# Patient Record
Sex: Female | Born: 1965 | Race: White | Hispanic: No | Marital: Married | Smoking: Former smoker
Health system: Southern US, Community
[De-identification: ages and names within clinical notes are randomized; demographics above are authoritative.]

## PROBLEM LIST (undated history)

## (undated) DIAGNOSIS — Z9889 Other specified postprocedural states: Secondary | ICD-10-CM

## (undated) DIAGNOSIS — F909 Attention-deficit hyperactivity disorder, unspecified type: Secondary | ICD-10-CM

## (undated) DIAGNOSIS — R112 Nausea with vomiting, unspecified: Secondary | ICD-10-CM

## (undated) DIAGNOSIS — Z973 Presence of spectacles and contact lenses: Secondary | ICD-10-CM

## (undated) DIAGNOSIS — F419 Anxiety disorder, unspecified: Secondary | ICD-10-CM

## (undated) DIAGNOSIS — K219 Gastro-esophageal reflux disease without esophagitis: Secondary | ICD-10-CM

## (undated) HISTORY — PX: BRACHIOPLASTY: SUR162

## (undated) HISTORY — PX: FOOT SURGERY: SHX648

## (undated) HISTORY — PX: INNER THIGH LIFT: SHX6416

## (undated) HISTORY — DX: Gastro-esophageal reflux disease without esophagitis: K21.9

## (undated) HISTORY — DX: Anxiety disorder, unspecified: F41.9

## (undated) HISTORY — PX: OTHER SURGICAL HISTORY: SHX169

---

## 2009-04-18 HISTORY — PX: BREAST BIOPSY: SHX20

## 2009-12-02 ENCOUNTER — Ambulatory Visit: Payer: Self-pay | Admitting: General Surgery

## 2010-05-20 ENCOUNTER — Ambulatory Visit: Payer: Self-pay | Admitting: General Surgery

## 2011-05-24 ENCOUNTER — Ambulatory Visit: Payer: Self-pay | Admitting: General Surgery

## 2011-07-14 ENCOUNTER — Ambulatory Visit: Payer: Self-pay | Admitting: Obstetrics & Gynecology

## 2011-07-14 LAB — CBC: MCH: 29.8 pg (ref 26.0–34.0)

## 2011-07-28 ENCOUNTER — Ambulatory Visit: Payer: Self-pay | Admitting: Obstetrics & Gynecology

## 2013-02-12 ENCOUNTER — Emergency Department: Payer: Self-pay | Admitting: Internal Medicine

## 2013-02-12 LAB — URINALYSIS, COMPLETE
Bilirubin,UR: NEGATIVE
Blood: NEGATIVE
Glucose,UR: NEGATIVE mg/dL (ref 0–75)
Nitrite: NEGATIVE
Protein: NEGATIVE
RBC,UR: 3 /HPF (ref 0–5)
Specific Gravity: 1.017 (ref 1.003–1.030)
Squamous Epithelial: 2

## 2013-02-12 LAB — COMPREHENSIVE METABOLIC PANEL
Albumin: 3.5 g/dL (ref 3.4–5.0)
Alkaline Phosphatase: 72 U/L (ref 50–136)
BUN: 14 mg/dL (ref 7–18)
Chloride: 103 mmol/L (ref 98–107)
Co2: 30 mmol/L (ref 21–32)
Creatinine: 1.01 mg/dL (ref 0.60–1.30)
Glucose: 104 mg/dL — ABNORMAL HIGH (ref 65–99)
Potassium: 3.4 mmol/L — ABNORMAL LOW (ref 3.5–5.1)
Total Protein: 7 g/dL (ref 6.4–8.2)

## 2013-02-12 LAB — CBC
HCT: 35.5 % (ref 35.0–47.0)
HGB: 12.5 g/dL (ref 12.0–16.0)
MCH: 29.7 pg (ref 26.0–34.0)
MCV: 84 fL (ref 80–100)
Platelet: 244 10*3/uL (ref 150–440)
RBC: 4.21 10*6/uL (ref 3.80–5.20)
RDW: 13.1 % (ref 11.5–14.5)

## 2013-02-19 ENCOUNTER — Other Ambulatory Visit: Payer: Self-pay | Admitting: Internal Medicine

## 2013-02-19 DIAGNOSIS — R109 Unspecified abdominal pain: Secondary | ICD-10-CM

## 2013-02-21 ENCOUNTER — Ambulatory Visit
Admission: RE | Admit: 2013-02-21 | Discharge: 2013-02-21 | Disposition: A | Discharge status: Home / Self Care | Payer: Managed Care, Other (non HMO) | Source: Ambulatory Visit | Attending: Internal Medicine | Admitting: Internal Medicine

## 2013-02-21 DIAGNOSIS — R109 Unspecified abdominal pain: Secondary | ICD-10-CM

## 2013-11-25 ENCOUNTER — Other Ambulatory Visit: Payer: Self-pay | Admitting: Internal Medicine

## 2014-08-10 NOTE — Op Note (Signed)
PATIENT NAME:  [name], Jennifer E MR#:  [id number] DATE OF BIRTH:  [birth year]  DATE OF PROCEDURE:  07/28/2011  PREOPERATIVE DIAGNOSIS: Menorrhagia, abnormal endometrium.  POSTOPERATIVE DIAGNOSIS: Menorrhagia, abnormal endometrium.   PROCEDURE PERFORMED: Hysteroscopy, dilation and curettage, and NovaSure endometrial ablation.   SURGEON: Annamarie MajorPaul Tameca Jerez, MD   ANESTHESIA: General.   ESTIMATED BLOOD LOSS: Minimal.   COMPLICATIONS: None.   FINDINGS: No polyps or fibroids noted.   DISPOSITION: To the recovery room in stable condition.   TECHNIQUE: The patient is prepped and draped in the usual sterile fashion after adequate anesthesia is obtained in the dorsal lithotomy position. The bladder is drained with a catheter. A speculum is placed, and the anterior lip of the cervix is grasped with a tenaculum. The cervix is measured at 2 cm, with uterine sounding length at 8 cm, with a cavity length of 6 cm. The cervix is easily dilated. A 30-degree hysteroscope with lactated Ringer's distention of the intrauterine cavity is performed with no abnormalities found. The hysteroscope is removed. The NovaSure device is placed. Cervical width of 2.7 cm is measured. Endometrial ablation procedure is performed using a power of 89 and a time of 91 seconds. The NovaSure device is removed, and the repeat hysteroscopy reveals appropriate ablation appearance to the lining of the endometrium. There is no sign of perforation or complication. The hysteroscope is removed with minimal discrepancy of lactated Ringer's fluid. The tenaculum is removed with no bleeding noted.    The patient goes to the recovery room in stable condition. All sponge, instrument, and needle counts are correct.   ____________________________ R. Annamarie MajorPaul Khup Sapia, MD rph:cbb D: 07/28/2011 14:14:42 ET T: 07/28/2011 15:52:20 ET JOB#: [id number]  cc: Dierdre Searles. Paul Audrey Thull, MD, <Dictator> Nadara MustardOBERT P Myriam Brandhorst MD ELECTRONICALLY SIGNED 08/01/2011 14:56

## 2014-08-26 ENCOUNTER — Ambulatory Visit: Payer: Self-pay | Admitting: Podiatry

## 2016-05-13 ENCOUNTER — Ambulatory Visit: Payer: Self-pay | Admitting: Podiatry

## 2016-05-24 ENCOUNTER — Encounter: Payer: Self-pay | Admitting: Podiatry

## 2016-05-24 ENCOUNTER — Ambulatory Visit (INDEPENDENT_AMBULATORY_CARE_PROVIDER_SITE_OTHER): Payer: Managed Care, Other (non HMO) | Admitting: Podiatry

## 2016-05-24 ENCOUNTER — Ambulatory Visit (INDEPENDENT_AMBULATORY_CARE_PROVIDER_SITE_OTHER): Payer: Managed Care, Other (non HMO)

## 2016-05-24 VITALS — Ht 60.0 in | Wt 190.0 lb

## 2016-05-24 DIAGNOSIS — M659 Synovitis and tenosynovitis, unspecified: Secondary | ICD-10-CM

## 2016-05-24 DIAGNOSIS — R52 Pain, unspecified: Secondary | ICD-10-CM | POA: Diagnosis not present

## 2016-05-24 DIAGNOSIS — M25572 Pain in left ankle and joints of left foot: Secondary | ICD-10-CM

## 2016-05-24 DIAGNOSIS — M7752 Other enthesopathy of left foot: Secondary | ICD-10-CM | POA: Diagnosis not present

## 2016-06-03 MED ORDER — BETAMETHASONE SOD PHOS & ACET 6 (3-3) MG/ML IJ SUSP: 3.0000 mg | Freq: Once | INTRAMUSCULAR | Status: DC

## 2016-06-03 NOTE — Progress Notes (Signed)
   Subjective:  Patient presents today for pain and tenderness to the left ankle. Patient relates significant pain and tenderness when walking. Patient states the pain is been going on for approximately 3 months now. Patient is training for a marathon. Patient has pain and swelling as her training has increased. Patient is currently taking 800 mg of ibuprofen every 2 hours for the past 2 weeks. Patient presents for further treatment and evaluation.  Objective / Physical Exam:  General:  The patient is alert and oriented x3 in no acute distress. Dermatology:  Skin is warm, dry and supple bilateral lower extremities. Negative for open lesions or macerations. Vascular:  Palpable pedal pulses bilaterally. No edema or erythema noted. Capillary refill within normal limits. Neurological:  Epicritic and protective threshold grossly intact bilaterally.  Musculoskeletal Exam:  Pain on palpation to the anterior lateral medial aspects of the patient's left ankle. Mild edema noted.  Range of motion within normal limits to all pedal and ankle joints bilateral. Muscle strength 5/5 in all groups bilateral.   Radiographic Exam:  Normal osseous mineralization. Joint spaces preserved. No fracture/dislocation/boney destruction.    Assessment: #1 pain in left ankle #2 synovitis of left ankle #3 capsulitis of left ankle  Plan of Care:  #1 Patient was evaluated. #2 injection of 0.5 mL Celestone Soluspan injected in the patient's left ankle. #3 continue wearing compression ankle sleeve #4 return to clinic in 3 weeks  Patient is running a marathon on March 3. Consider anti-inflammatory injection right before the race  Felecia ShellingBrent M. Felix Pratt, DPM [facility]  Dr. Felecia ShellingBrent M. Yasmene Salomone, DPM    [address]                                        [place], [place] [address]                Office [phone]  Fax [phone]) [phone]

## 2016-06-14 ENCOUNTER — Ambulatory Visit (INDEPENDENT_AMBULATORY_CARE_PROVIDER_SITE_OTHER): Payer: Managed Care, Other (non HMO) | Admitting: Podiatry

## 2016-06-14 ENCOUNTER — Encounter: Payer: Self-pay | Admitting: Podiatry

## 2016-06-14 DIAGNOSIS — M25572 Pain in left ankle and joints of left foot: Secondary | ICD-10-CM

## 2016-06-14 DIAGNOSIS — M7752 Other enthesopathy of left foot: Secondary | ICD-10-CM | POA: Diagnosis not present

## 2016-06-14 DIAGNOSIS — M659 Synovitis and tenosynovitis, unspecified: Secondary | ICD-10-CM

## 2016-06-19 MED ORDER — BETAMETHASONE SOD PHOS & ACET 6 (3-3) MG/ML IJ SUSP: 3.0000 mg | Freq: Once | INTRAMUSCULAR | Status: DC

## 2016-06-19 NOTE — Progress Notes (Signed)
   Subjective:  atient presents today for follow-up treatment and evaluation of left ankle pain. Patient states that she is still having some pain. She does feel much better and she states that the injections did help. The patient is running a 26.2 marathon this Saturday.  Objective / Physical Exam:  General:  The patient is alert and oriented x3 in no acute distress. Dermatology:  Skin is warm, dry and supple bilateral lower extremities. Negative for open lesions or macerations. Vascular:  Palpable pedal pulses bilaterally. No edema or erythema noted. Capillary refill within normal limits. Neurological:  Epicritic and protective threshold grossly intact bilaterally.  Musculoskeletal Exam:  Pain on palpation to the anterior lateral medial aspects of the patient's left ankle. Mild edema noted.  Range of motion within normal limits to all pedal and ankle joints bilateral. Muscle strength 5/5 in all groups bilateral.   Assessment: #1 pain in left ankle #2 synovitis of left ankle #3 capsulitis of left ankle  Plan of Care:  #1 Patient was evaluated. #2 injection of 0.5 mL Celestone Soluspan injected in the patient's left ankle. #3 continue wearing compression ankle sleeve #4 continue ibuprofen 800 mg as needed  #5 return to clinic when necessary  Felecia ShellingBrent M. Evans, DPM [facility]  Dr. Felecia ShellingBrent M. Evans, DPM    [address]                                        [place], [place] [address]                Office [phone]) [phone]  Fax [phone]

## 2017-04-24 ENCOUNTER — Ambulatory Visit: Payer: Self-pay | Admitting: Nurse Practitioner

## 2017-06-14 ENCOUNTER — Other Ambulatory Visit: Payer: Self-pay

## 2017-06-14 MED ORDER — SERTRALINE HCL 50 MG PO TABS: 50.0000 mg | ORAL_TABLET | Freq: Every day | ORAL | 0 refills | Status: DC

## 2017-07-05 ENCOUNTER — Other Ambulatory Visit: Payer: Self-pay

## 2017-07-05 MED ORDER — OMEPRAZOLE 10 MG PO CPDR: 10.0000 mg | DELAYED_RELEASE_CAPSULE | Freq: Every day | ORAL | 2 refills | Status: DC

## 2017-07-05 MED ORDER — MONTELUKAST SODIUM 10 MG PO TABS: 10.0000 mg | ORAL_TABLET | Freq: Every day | ORAL | 1 refills | Status: DC

## 2017-08-08 ENCOUNTER — Encounter: Payer: Self-pay | Admitting: Nurse Practitioner

## 2017-08-08 ENCOUNTER — Ambulatory Visit: Payer: Managed Care, Other (non HMO) | Admitting: Nurse Practitioner

## 2017-08-08 VITALS — BP 105/55 | HR 57 | Resp 16 | Ht 62.0 in | Wt 164.0 lb

## 2017-08-08 DIAGNOSIS — J301 Allergic rhinitis due to pollen: Secondary | ICD-10-CM

## 2017-08-08 DIAGNOSIS — F3342 Major depressive disorder, recurrent, in full remission: Secondary | ICD-10-CM | POA: Diagnosis not present

## 2017-08-08 DIAGNOSIS — K219 Gastro-esophageal reflux disease without esophagitis: Secondary | ICD-10-CM | POA: Diagnosis not present

## 2017-08-08 MED ORDER — OMEPRAZOLE 10 MG PO CPDR: 10.0000 mg | DELAYED_RELEASE_CAPSULE | Freq: Every day | ORAL | 4 refills | Status: DC

## 2017-08-08 MED ORDER — SERTRALINE HCL 50 MG PO TABS
50.0000 mg | ORAL_TABLET | Freq: Every day | ORAL | 4 refills | Status: DC
Start: 2017-08-08 — End: 2018-07-16

## 2017-08-08 MED ORDER — MONTELUKAST SODIUM 10 MG PO TABS: 10.0000 mg | ORAL_TABLET | Freq: Every day | ORAL | 4 refills | Status: DC

## 2017-08-08 NOTE — Progress Notes (Signed)
[facility] [address] [place], [place] [address]  Internal MEDICINE  Office Visit Note  Patient Name: Jennifer Branch  604540Mar 05, 2067  [id number]  Date of Service: 08/30/2017  Chief Complaint  Patient presents with  . Follow-up    The patient is here to have refills on her routine medications. She has vey well controlled depression with dose of sertaline. Has many more good days than bad. She also suffers chronic allergies. She takes OTC Zyrtec every day and also takes singulair for which she needs a refills. Finally, she needs refill for her omeprazole.    Pt is here for routine follow up.    Current Medication: Outpatient Encounter Medications as of 08/08/2017  Medication Sig  . montelukast (SINGULAIR) 10 MG tablet Take 1 tablet (10 mg total) by mouth at bedtime.  Marland Kitchen. omeprazole (PRILOSEC) 10 MG capsule Take 1 capsule (10 mg total) by mouth daily.  . sertraline (ZOLOFT) 50 MG tablet Take 1 tablet (50 mg total) by mouth daily.  . [DISCONTINUED] montelukast (SINGULAIR) 10 MG tablet Take 1 tablet (10 mg total) by mouth at bedtime.  . [DISCONTINUED] omeprazole (PRILOSEC) 10 MG capsule Take 1 capsule (10 mg total) by mouth daily.  . [DISCONTINUED] sertraline (ZOLOFT) 50 MG tablet Take 1 tablet (50 mg total) by mouth daily.   Facility-Administered Encounter Medications as of 08/08/2017  Medication  . betamethasone acetate-betamethasone sodium phosphate (CELESTONE) injection 3 mg  . betamethasone acetate-betamethasone sodium phosphate (CELESTONE) injection 3 mg    Surgical History: Past Surgical History:  Procedure Laterality Date  . BRACHIOPLASTY Left   . FOOT SURGERY Left   . tummy tuck      Medical History: Past Medical History:  Diagnosis Date  . Anxiety   . GERD (gastroesophageal reflux disease)     Family History: Family History  Problem Relation Age of Onset  . COPD Mother   . Hypertension Father   . Hodgkin's lymphoma Brother   . Lung cancer  Maternal Grandfather     Social History   Socioeconomic History  . Marital status: Married    Spouse name: Not on file  . Number of children: Not on file  . Years of education: Not on file  . Highest education level: Not on file  Occupational History  . Not on file  Social Needs  . Financial resource strain: Not on file  . Food insecurity:    Worry: Not on file    Inability: Not on file  . Transportation needs:    Medical: Not on file    Non-medical: Not on file  Tobacco Use  . Smoking status: Former Games developermoker  . Smokeless tobacco: Never Used  Substance and Sexual Activity  . Alcohol use: Never    Frequency: Never  . Drug use: Never  . Sexual activity: Not on file  Lifestyle  . Physical activity:    Days per week: Not on file    Minutes per session: Not on file  . Stress: Not on file  Relationships  . Social connections:    Talks on phone: Not on file    Gets together: Not on file    Attends religious service: Not on file    Active member of club or organization: Not on file    Attends meetings of clubs or organizations: Not on file    Relationship status: Not on file  . Intimate partner violence:    Fear of current or ex partner: Not on file  Emotionally abused: Not on file    Physically abused: Not on file    Forced sexual activity: Not on file  Other Topics Concern  . Not on file  Social History Narrative  . Not on file      Review of Systems  Constitutional: Negative for activity change, chills, fatigue and unexpected weight change.       Patient has lost over 150 pounds over the past 1 to 2 years.   HENT: Negative for congestion, postnasal drip, rhinorrhea, sneezing and sore throat.   Eyes: Negative.  Negative for redness.  Respiratory: Negative for cough, chest tightness, shortness of breath and wheezing.   Cardiovascular: Negative for chest pain and palpitations.  Gastrointestinal: Negative for abdominal pain, constipation, diarrhea, nausea and  vomiting.  Endocrine: Negative for cold intolerance, heat intolerance, polydipsia, polyphagia and polyuria.  Genitourinary: Negative for dysuria and frequency.  Musculoskeletal: Negative for arthralgias, back pain, joint swelling and neck pain.  Skin: Negative for rash.  Allergic/Immunologic: Positive for environmental allergies.  Neurological: Negative for dizziness, tremors, numbness and headaches.  Hematological: Negative for adenopathy. Does not bruise/bleed easily.  Psychiatric/Behavioral: Negative for behavioral problems (Depression), sleep disturbance and suicidal ideas. The patient is not nervous/anxious.     Vital Signs: BP (!) 105/55   Pulse (!) 57   Resp 16   Ht 5\' 2"  (1.575 m)   Wt 164 lb (74.4 kg)   SpO2 97%   BMI 30.00 kg/m    Physical Exam  Constitutional: She is oriented to person, place, and time. She appears well-developed and well-nourished. No distress.  HENT:  Head: Normocephalic and atraumatic.  Nose: Nose normal.  Mouth/Throat: Oropharynx is clear and moist. No oropharyngeal exudate.  Eyes: Pupils are equal, round, and reactive to light. EOM are normal.  Neck: Normal range of motion. Neck supple. No JVD present. Carotid bruit is not present. No tracheal deviation present. No thyromegaly present.  Cardiovascular: Normal rate, regular rhythm, normal heart sounds and intact distal pulses. Exam reveals no gallop and no friction rub.  No murmur heard. Pulmonary/Chest: Effort normal and breath sounds normal. No respiratory distress. She has no wheezes. She has no rales. She exhibits no tenderness.  Abdominal: Soft. Bowel sounds are normal. There is no tenderness.  Musculoskeletal: Normal range of motion.  Lymphadenopathy:    She has no cervical adenopathy.  Neurological: She is alert and oriented to person, place, and time. No cranial nerve deficit.  Skin: Skin is warm and dry. Capillary refill takes less than 2 seconds. She is not diaphoretic.  Psychiatric:  She has a normal mood and affect. Her behavior is normal. Judgment and thought content normal.  Nursing note and vitals reviewed.  Assessment/Plan: 1. Allergic rhinitis due to pollen, unspecified seasonality conitnue zyrtec every day and sigulair every evening. Use flonase nasal spray as indicated.  - montelukast (SINGULAIR) 10 MG tablet; Take 1 tablet (10 mg total) by mouth at bedtime.  Dispense: 90 tablet; Refill: 4  2. Gastroesophageal reflux disease without esophagitis Continue omeprazole as prescribed  - omeprazole (PRILOSEC) 10 MG capsule; Take 1 capsule (10 mg total) by mouth daily.  Dispense: 90 capsule; Refill: 4  3. Recurrent major depressive disorder, in full remission (HCC) Well managed o sertraline 50mg  daily. Refill provided today.  - sertraline (ZOLOFT) 50 MG tablet; Take 1 tablet (50 mg total) by mouth daily.  Dispense: 90 tablet; Refill: 4  General Counseling: Jennifer Branch verbalizes understanding of the findings of todays visit and agrees with plan  of treatment. I have discussed any further diagnostic evaluation that may be needed or ordered today. We also reviewed her medications today. she has been encouraged to call the office with any questions or concerns that should arise related to todays visit.   This patient was seen by Vincent Gros, FNP- C in Collaboration with Dr Lyndon Code as a part of collaborative care agreement  Meds ordered this encounter  Medications  . sertraline (ZOLOFT) 50 MG tablet    Sig: Take 1 tablet (50 mg total) by mouth daily.    Dispense:  90 tablet    Refill:  4    Order Specific Question:   Supervising Provider    Answer:   Lyndon Code [[id number]]  . omeprazole (PRILOSEC) 10 MG capsule    Sig: Take 1 capsule (10 mg total) by mouth daily.    Dispense:  90 capsule    Refill:  4    Order Specific Question:   Supervising Provider    Answer:   Lyndon Code [[id number]]  . montelukast (SINGULAIR) 10 MG tablet    Sig: Take 1 tablet (10 mg total) by  mouth at bedtime.    Dispense:  90 tablet    Refill:  4    Order Specific Question:   Supervising Provider    Answer:   Lyndon Code [[id number]]    Time spent: 17 Minutes    Dr Lyndon Code Internal medicine

## 2017-08-23 ENCOUNTER — Ambulatory Visit: Payer: Managed Care, Other (non HMO) | Admitting: Internal Medicine

## 2017-08-23 ENCOUNTER — Encounter: Payer: Self-pay | Admitting: Internal Medicine

## 2017-08-23 VITALS — BP 107/48 | HR 58 | Temp 97.2°F | Resp 16 | Ht 62.0 in | Wt 167.0 lb

## 2017-08-23 DIAGNOSIS — J029 Acute pharyngitis, unspecified: Secondary | ICD-10-CM | POA: Diagnosis not present

## 2017-08-23 DIAGNOSIS — R6889 Other general symptoms and signs: Secondary | ICD-10-CM

## 2017-08-23 LAB — POCT INFLUENZA A/B
Influenza A, POC: NEGATIVE
Influenza B, POC: NEGATIVE

## 2017-08-23 LAB — POCT RAPID STREP A (OFFICE): RAPID STREP A SCREEN: NEGATIVE

## 2017-08-23 NOTE — Progress Notes (Signed)
[facility] [address] [place], [place] [address]  Internal MEDICINE  Office Visit Note  Patient Name: Jennifer Branch  [id number]  [id number]  Date of Service: 08/30/2017  Chief Complaint  Patient presents with  . Chills    started monday night has gotten better  . Cough  . Sore Throat  . Joint Pain     HPI Pt is here for a sick visit. Started as sore throat and headaches. She rested yesterday and does feel somewhat better. No recent exposure to flu Current Medication:  Outpatient Encounter Medications as of 08/23/2017  Medication Sig  . montelukast (SINGULAIR) 10 MG tablet Take 1 tablet (10 mg total) by mouth at bedtime.  Marland Kitchen omeprazole (PRILOSEC) 10 MG capsule Take 1 capsule (10 mg total) by mouth daily.  . sertraline (ZOLOFT) 50 MG tablet Take 1 tablet (50 mg total) by mouth daily.   Facility-Administered Encounter Medications as of 08/23/2017  Medication  . betamethasone acetate-betamethasone sodium phosphate (CELESTONE) injection 3 mg  . betamethasone acetate-betamethasone sodium phosphate (CELESTONE) injection 3 mg      Medical History: Past Medical History:  Diagnosis Date  . Anxiety   . GERD (gastroesophageal reflux disease)      Vital Signs: BP (!) 107/48 (BP Location: Right Arm, Patient Position: Sitting, Cuff Size: Normal)   Pulse (!) 58   Temp (!) 97.2 F (36.2 C)   Resp 16   Ht  (1.575 m)   Wt 167 lb (75.8 kg)   SpO2 98%   BMI 30.54 kg/m    Review of Systems  Constitutional: Positive for chills. Negative for diaphoresis and fatigue.  HENT: Positive for postnasal drip and sinus pressure. Negative for ear pain.   Eyes: Negative for photophobia, discharge, redness, itching and visual disturbance.  Respiratory: Positive for cough. Negative for shortness of breath and wheezing.   Cardiovascular: Negative for chest pain.  Gastrointestinal: Negative for abdominal pain, constipation, diarrhea, nausea and vomiting.  Genitourinary:  Negative for dysuria and flank pain.  Musculoskeletal: Positive for arthralgias and myalgias.  Skin: Negative for color change.  Allergic/Immunologic: Negative for environmental allergies and food allergies.  Neurological: Positive for headaches.  Psychiatric/Behavioral: Behavioral problem: depression.   Physical Exam  Constitutional: She is oriented to person, place, and time. She appears well-developed and well-nourished. No distress.  HENT:  Head: Normocephalic and atraumatic.  Mouth/Throat: Posterior oropharyngeal erythema present.  Eyes: Pupils are equal, round, and reactive to light. EOM are normal.  Neck: No JVD present. No tracheal deviation present. No thyromegaly present.  Cardiovascular: Normal rate, regular rhythm and normal heart sounds. Exam reveals no gallop and no friction rub.  No murmur heard. Pulmonary/Chest: Effort normal. No respiratory distress. She has no wheezes. She has no rales. She exhibits no tenderness.  Abdominal: Soft.  Lymphadenopathy:    She has no cervical adenopathy.  Neurological: She is alert and oriented to person, place, and time. No cranial nerve deficit.  Skin: She is not diaphoretic.  Psychiatric: Judgment and thought content normal.   Assessment/Plan: 1. Sore throat - POCT rapid strep A. Negative   2. Flu-like symptoms - POCT Influenza A/B negative   Reassurance is given, rest, increase po fluids  General Counseling: Jennifer Branch verbalizes understanding of the findings of todays visit and agrees with plan of treatment. I have discussed any further diagnostic evaluation that may be needed or ordered today. We also reviewed her medications today. she has been encouraged to call the office with any questions  or concerns that should arise related to todays visit.   Orders Placed This Encounter  Procedures  . POCT rapid strep A  . POCT Influenza A/B     Time spent:15 Minutes

## 2017-08-30 DIAGNOSIS — F3342 Major depressive disorder, recurrent, in full remission: Secondary | ICD-10-CM | POA: Insufficient documentation

## 2017-08-30 DIAGNOSIS — J301 Allergic rhinitis due to pollen: Secondary | ICD-10-CM | POA: Insufficient documentation

## 2017-08-30 DIAGNOSIS — K219 Gastro-esophageal reflux disease without esophagitis: Secondary | ICD-10-CM | POA: Insufficient documentation

## 2017-12-12 ENCOUNTER — Ambulatory Visit: Payer: Managed Care, Other (non HMO) | Admitting: Obstetrics & Gynecology

## 2017-12-15 ENCOUNTER — Ambulatory Visit: Payer: Managed Care, Other (non HMO) | Admitting: Adult Health

## 2017-12-15 ENCOUNTER — Encounter: Payer: Self-pay | Admitting: Adult Health

## 2017-12-15 VITALS — BP 112/70 | HR 58 | Resp 16 | Ht 62.0 in | Wt 172.2 lb

## 2017-12-15 DIAGNOSIS — K219 Gastro-esophageal reflux disease without esophagitis: Secondary | ICD-10-CM

## 2017-12-15 DIAGNOSIS — F3342 Major depressive disorder, recurrent, in full remission: Secondary | ICD-10-CM | POA: Diagnosis not present

## 2017-12-15 DIAGNOSIS — J302 Other seasonal allergic rhinitis: Secondary | ICD-10-CM | POA: Diagnosis not present

## 2017-12-15 NOTE — Progress Notes (Signed)
[facility] [address] [place], [place] [address]  Internal MEDICINE  Office Visit Note  Patient Name: Jennifer Branch  [id number]  [id number]  Date of Service: 12/31/2017  Chief Complaint  Patient presents with  . Medical Management of Chronic Issues    bmi appeal   . Depression  . Gastroesophageal Reflux    HPI Pt here for follow up with allergies, GERD and depression.  She is generally doing well.  She has no complaints.  She works at [employer] and needs some forms filled out.  She denies tobacco abuse.  She reports drinking wine once a month. She denies GERD symptoms, and her depression is under control.   Current Medication: Outpatient Encounter Medications as of 12/15/2017  Medication Sig  . Black Cohosh (BLACK COHOSH HOT FLASH RELIEF) 40 MG CAPS Take by mouth.  . montelukast (SINGULAIR) 10 MG tablet Take 1 tablet (10 mg total) by mouth at bedtime.  Marland Kitchen. omeprazole (PRILOSEC) 10 MG capsule Take 1 capsule (10 mg total) by mouth daily.  . sertraline (ZOLOFT) 50 MG tablet Take 1 tablet (50 mg total) by mouth daily.   Facility-Administered Encounter Medications as of 12/15/2017  Medication  . betamethasone acetate-betamethasone sodium phosphate (CELESTONE) injection 3 mg  . betamethasone acetate-betamethasone sodium phosphate (CELESTONE) injection 3 mg    Surgical History: Past Surgical History:  Procedure Laterality Date  . BRACHIOPLASTY Left   . FOOT SURGERY Left   . tummy tuck      Medical History: Past Medical History:  Diagnosis Date  . Anxiety   . GERD (gastroesophageal reflux disease)     Family History: Family History  Problem Relation Age of Onset  . COPD Mother   . Hypertension Father   . Hodgkin's lymphoma Brother   . Lung cancer Maternal Grandfather     Social History   Socioeconomic History  . Marital status: Married    Spouse name: Not on file  . Number of children: Not on file  . Years of education: Not on file  . Highest  education level: Not on file  Occupational History  . Not on file  Social Needs  . Financial resource strain: Not on file  . Food insecurity:    Worry: Not on file    Inability: Not on file  . Transportation needs:    Medical: Not on file    Non-medical: Not on file  Tobacco Use  . Smoking status: Former Games developermoker  . Smokeless tobacco: Never Used  Substance and Sexual Activity  . Alcohol use: Never    Frequency: Never  . Drug use: Never  . Sexual activity: Not on file  Lifestyle  . Physical activity:    Days per week: Not on file    Minutes per session: Not on file  . Stress: Not on file  Relationships  . Social connections:    Talks on phone: Not on file    Gets together: Not on file    Attends religious service: Not on file    Active member of club or organization: Not on file    Attends meetings of clubs or organizations: Not on file    Relationship status: Not on file  . Intimate partner violence:    Fear of current or ex partner: Not on file    Emotionally abused: Not on file    Physically abused: Not on file    Forced sexual activity: Not on file  Other Topics Concern  . Not on  file  Social History Narrative  . Not on file   Review of Systems  Constitutional: Negative for chills, fatigue and unexpected weight change.  HENT: Negative for congestion, rhinorrhea, sneezing and sore throat.   Eyes: Negative for photophobia, pain and redness.  Respiratory: Negative for cough, chest tightness and shortness of breath.   Cardiovascular: Negative for chest pain and palpitations.  Gastrointestinal: Negative for abdominal pain, constipation, diarrhea, nausea and vomiting.  Endocrine: Negative.   Genitourinary: Negative for dysuria and frequency.  Musculoskeletal: Negative for arthralgias, back pain, joint swelling and neck pain.  Skin: Negative for rash.  Allergic/Immunologic: Negative.   Neurological: Negative for tremors and numbness.  Hematological: Negative for  adenopathy. Does not bruise/bleed easily.  Psychiatric/Behavioral: Negative for behavioral problems and sleep disturbance. The patient is not nervous/anxious.    Vital Signs: BP 112/70   Pulse (!) 58   Resp 16   Ht 5\' 2"  (1.575 m)   Wt 172 lb 3.2 oz (78.1 kg)   SpO2 98%   BMI 31.50 kg/m   Physical Exam  Constitutional: She is oriented to person, place, and time. She appears well-developed and well-nourished. No distress.  HENT:  Head: Normocephalic and atraumatic.  Mouth/Throat: Oropharynx is clear and moist. No oropharyngeal exudate.  Eyes: Pupils are equal, round, and reactive to light. EOM are normal.  Neck: Normal range of motion. Neck supple. No JVD present. No tracheal deviation present. No thyromegaly present.  Cardiovascular: Normal rate, regular rhythm and normal heart sounds. Exam reveals no gallop and no friction rub.  No murmur heard. Pulmonary/Chest: Effort normal and breath sounds normal. No respiratory distress. She has no wheezes. She has no rales. She exhibits no tenderness.  Abdominal: Soft. There is no tenderness. There is no guarding.  Musculoskeletal: Normal range of motion.  Lymphadenopathy:    She has no cervical adenopathy.  Neurological: She is alert and oriented to person, place, and time. No cranial nerve deficit.  Skin: Skin is warm and dry. She is not diaphoretic.  Psychiatric: She has a normal mood and affect. Her behavior is normal. Judgment and thought content normal.  Nursing note and vitals reviewed.  Assessment/Plan: 1. Gastroesophageal reflux disease without esophagitis Pt continues to take Prilosec, with good relief.   2. Recurrent major depressive disorder, in full remission (HCC) Continue Zoloft as prescribed.  No issues with depression, pt reports she has been doing very well.     3. Seasonal allergies Continue to take medications   General Counseling: indica marcott understanding of the findings of todays visit and agrees with plan  of treatment. I have discussed any further diagnostic evaluation that may be needed or ordered today. We also reviewed her medications today. she has been encouraged to call the office with any questions or concerns that should arise related to todays visit.   Time spent: 25 Minutes   This patient was seen by Blima Ledger AGNP-C in Collaboration with Dr Lyndon Code as a part of collaborative care agreement    Dr Lyndon Code Internal medicine

## 2017-12-15 NOTE — Patient Instructions (Signed)

## 2017-12-22 ENCOUNTER — Ambulatory Visit: Payer: Self-pay | Admitting: Nurse Practitioner

## 2017-12-22 ENCOUNTER — Ambulatory Visit: Payer: Self-pay | Admitting: Adult Health

## 2018-01-23 ENCOUNTER — Encounter: Payer: Self-pay | Admitting: Obstetrics & Gynecology

## 2018-01-23 ENCOUNTER — Ambulatory Visit (INDEPENDENT_AMBULATORY_CARE_PROVIDER_SITE_OTHER): Payer: Managed Care, Other (non HMO) | Admitting: Obstetrics & Gynecology

## 2018-01-23 VITALS — BP 120/80 | Ht 62.0 in | Wt 168.0 lb

## 2018-01-23 DIAGNOSIS — Z1239 Encounter for other screening for malignant neoplasm of breast: Secondary | ICD-10-CM | POA: Diagnosis not present

## 2018-01-23 DIAGNOSIS — Z124 Encounter for screening for malignant neoplasm of cervix: Secondary | ICD-10-CM | POA: Diagnosis not present

## 2018-01-23 DIAGNOSIS — Z0000 Encounter for general adult medical examination without abnormal findings: Secondary | ICD-10-CM

## 2018-01-23 DIAGNOSIS — Z1211 Encounter for screening for malignant neoplasm of colon: Secondary | ICD-10-CM

## 2018-01-23 NOTE — Progress Notes (Signed)
HPI:      [name] is a 52 y.o. [id number] who LMP was in the past, she presents today for her annual examination.  The patient has no complaints today. The patient is sexually active. Herlast pap: approximate date 2015 and was normal and last mammogram: approximate date 2016 and was normal.  The patient does perform self breast exams.  There is no notable family history of breast or ovarian cancer in her family. The patient is not currently taking hormone replacement therapy. Rare spotting (has had ablation), and has some hot flashes for which Jennifer Cohash helps.   The patient has regular exercise: yes. The patient denies current symptoms of depression.    GYN Hx: Last Colonoscopy:never ago.   PMHx: Past Medical History:  Diagnosis Date  . Anxiety   . GERD (gastroesophageal reflux disease)    Past Surgical History:  Procedure Laterality Date  . BRACHIOPLASTY Left   . FOOT SURGERY Left   . tummy tuck     Family History  Problem Relation Age of Onset  . COPD Mother   . Hypertension Father   . Hodgkin's lymphoma Brother   . Lung cancer Maternal Grandfather    Social History   Tobacco Use  . Smoking status: Former Games developer  . Smokeless tobacco: Never Used  Substance Use Topics  . Alcohol use: Never    Frequency: Never  . Drug use: Never    Current Outpatient Medications:  .  Jennifer Branch (Jennifer Branch HOT FLASH RELIEF) 40 MG CAPS, Take by mouth., Disp: , Rfl:  .  omeprazole (PRILOSEC) 10 MG capsule, Take 1 capsule (10 mg total) by mouth daily., Disp: 90 capsule, Rfl: 4 .  montelukast (SINGULAIR) 10 MG tablet, Take 1 tablet (10 mg total) by mouth at bedtime. (Patient not taking: Reported on 01/23/2018), Disp: 90 tablet, Rfl: 4 .  sertraline (ZOLOFT) 50 MG tablet, Take 1 tablet (50 mg total) by mouth daily. (Patient not taking: Reported on 01/23/2018), Disp: 90 tablet, Rfl: 4  Current Facility-Administered Medications:  .  betamethasone acetate-betamethasone sodium  phosphate (CELESTONE) injection 3 mg, 3 mg, Intramuscular, Once, Evans, Brent M, DPM .  betamethasone acetate-betamethasone sodium phosphate (CELESTONE) injection 3 mg, 3 mg, Intramuscular, Once, Evans, Larena Glassman, DPM Allergies: Parke Simmers allergy] and Penicillins  Review of Systems  Constitutional: Negative for chills, fever and malaise/fatigue.  HENT: Negative for congestion, sinus pain and sore throat.   Eyes: Negative for blurred vision and pain.  Respiratory: Negative for cough and wheezing.   Cardiovascular: Negative for chest pain and leg swelling.  Gastrointestinal: Negative for abdominal pain, constipation, diarrhea, heartburn, nausea and vomiting.  Genitourinary: Negative for dysuria, frequency, hematuria and urgency.  Musculoskeletal: Negative for back pain, joint pain, myalgias and neck pain.  Skin: Negative for itching and rash.  Neurological: Negative for dizziness, tremors and weakness.  Endo/Heme/Allergies: Does not bruise/bleed easily.  Psychiatric/Behavioral: Negative for depression. The patient is not nervous/anxious and does not have insomnia.     Objective: BP 120/80   Ht 5\' 2"  (1.575 m)   Wt 168 lb (76.2 kg)   BMI 30.73 kg/m   Filed Weights   01/23/18 0809  Weight: 168 lb (76.2 kg)   Body mass index is 30.73 kg/m. Physical Exam  Constitutional: She is oriented to person, place, and time. She appears well-developed and well-nourished. No distress.  Genitourinary: Rectum normal, vagina normal and uterus normal. Pelvic exam was performed with patient supine. There is no rash or  lesion on the right labia. There is no rash or lesion on the left labia. Vagina exhibits no lesion. No bleeding in the vagina. Right adnexum does not display mass and does not display tenderness. Left adnexum does not display mass and does not display tenderness. Cervix does not exhibit motion tenderness, lesion, friability or polyp.   Uterus is mobile and midaxial. Uterus is not  enlarged or exhibiting a mass.  HENT:  Head: Normocephalic and atraumatic. Head is without laceration.  Right Ear: Hearing normal.  Left Ear: Hearing normal.  Nose: No epistaxis.  No foreign bodies.  Mouth/Throat: Uvula is midline, oropharynx is clear and moist and mucous membranes are normal.  Eyes: Pupils are equal, round, and reactive to light.  Neck: Normal range of motion. Neck supple. No thyromegaly present.  Cardiovascular: Normal rate and regular rhythm. Exam reveals no gallop and no friction rub.  No murmur heard. Pulmonary/Chest: Effort normal and breath sounds normal. No respiratory distress. She has no wheezes. Right breast exhibits no mass, no skin change and no tenderness. Left breast exhibits no mass, no skin change and no tenderness.  Abdominal: Soft. Bowel sounds are normal. She exhibits no distension. There is no tenderness. There is no rebound.  Musculoskeletal: Normal range of motion.  Neurological: She is alert and oriented to person, place, and time. No cranial nerve deficit.  Skin: Skin is warm and dry.  Psychiatric: She has a normal mood and affect. Judgment normal.  Vitals reviewed.  Assessment: Annual Exam 1. Annual physical exam   2. Screening for cervical cancer   3. Screening for breast cancer   4. Screen for colon cancer    Plan:            1.  Cervical Screening-  Pap smear done today  2. Breast screening- Exam annually and mammogram scheduled  3. Colonoscopy every 10 years, Hemoccult testing after age 89  4. Labs managed by PCP  5. Counseling for hormonal therapy: none.  Cont Jennifer Cohash for vasomotor sx's.  Consider Clonidine as next option.  Cont exercise.  6. Flu shot at work    F/U  Return in about 1 year (around 01/24/2019) for Annual.  Annamarie Major, MD, [facility], [facility] 01/23/2018  8:36 AM

## 2018-01-23 NOTE — Patient Instructions (Signed)
PAP every three years Mammogram every year    Call [phone] to schedule at [facility] Colonoscopy every 10 years Labs yearly (with PCP) 

## 2018-01-29 LAB — IGP, APTIMA HPV
HPV Aptima: NEGATIVE
PAP Smear Comment: 0

## 2018-02-13 ENCOUNTER — Ambulatory Visit
Admission: RE | Admit: 2018-02-13 | Discharge: 2018-02-13 | Disposition: A | Discharge status: Home / Self Care | Payer: Managed Care, Other (non HMO) | Source: Ambulatory Visit | Attending: Obstetrics & Gynecology | Admitting: Obstetrics & Gynecology

## 2018-02-13 DIAGNOSIS — Z1239 Encounter for other screening for malignant neoplasm of breast: Secondary | ICD-10-CM

## 2018-02-26 ENCOUNTER — Encounter: Payer: Self-pay | Admitting: Obstetrics & Gynecology

## 2018-07-16 ENCOUNTER — Other Ambulatory Visit: Payer: Self-pay | Admitting: Nurse Practitioner

## 2018-07-16 DIAGNOSIS — F3342 Major depressive disorder, recurrent, in full remission: Secondary | ICD-10-CM

## 2018-07-16 MED ORDER — SERTRALINE HCL 50 MG PO TABS: 50.0000 mg | ORAL_TABLET | Freq: Every day | ORAL | 4 refills | Status: DC

## 2018-08-06 ENCOUNTER — Other Ambulatory Visit: Payer: Self-pay

## 2018-08-08 ENCOUNTER — Other Ambulatory Visit: Payer: Self-pay | Admitting: Nurse Practitioner

## 2018-08-08 DIAGNOSIS — K219 Gastro-esophageal reflux disease without esophagitis: Secondary | ICD-10-CM

## 2018-08-08 DIAGNOSIS — J301 Allergic rhinitis due to pollen: Secondary | ICD-10-CM

## 2018-08-08 MED ORDER — OMEPRAZOLE 10 MG PO CPDR: 10.0000 mg | DELAYED_RELEASE_CAPSULE | Freq: Every day | ORAL | 4 refills | Status: DC

## 2018-08-08 MED ORDER — MONTELUKAST SODIUM 10 MG PO TABS: 10.0000 mg | ORAL_TABLET | Freq: Every day | ORAL | 4 refills | Status: DC

## 2018-08-09 ENCOUNTER — Other Ambulatory Visit: Payer: Self-pay

## 2018-08-09 ENCOUNTER — Ambulatory Visit: Payer: Managed Care, Other (non HMO) | Admitting: Nurse Practitioner

## 2018-08-09 ENCOUNTER — Encounter: Payer: Self-pay | Admitting: Nurse Practitioner

## 2018-08-09 DIAGNOSIS — F3342 Major depressive disorder, recurrent, in full remission: Secondary | ICD-10-CM | POA: Diagnosis not present

## 2018-08-09 DIAGNOSIS — K219 Gastro-esophageal reflux disease without esophagitis: Secondary | ICD-10-CM

## 2018-08-09 DIAGNOSIS — J301 Allergic rhinitis due to pollen: Secondary | ICD-10-CM | POA: Diagnosis not present

## 2018-08-09 MED ORDER — MONTELUKAST SODIUM 10 MG PO TABS: 10.0000 mg | ORAL_TABLET | Freq: Every day | ORAL | 3 refills | Status: DC

## 2018-08-09 MED ORDER — OMEPRAZOLE 10 MG PO CPDR: 10.0000 mg | DELAYED_RELEASE_CAPSULE | Freq: Every day | ORAL | 3 refills | Status: DC

## 2018-08-09 MED ORDER — SERTRALINE HCL 50 MG PO TABS: 50.0000 mg | ORAL_TABLET | Freq: Every day | ORAL | 3 refills | Status: DC

## 2018-08-09 NOTE — Progress Notes (Signed)
[facility] [address] [place], [place] [address]  Internal MEDICINE  Telephone Visit  Patient Name: Jennifer Branch  [id number]  [id number]  Date of Service: 08/29/2018  I connected with the patient at 4:58pm by webcam and verified the patients identity using two identifiers.   I discussed the limitations, risks, security and privacy concerns of performing an evaluation and management service by webcam and the availability of in person appointments. I also discussed with the patient that there may be a patient responsible charge related to the service.  The patient expressed understanding and agrees to proceed.    Chief Complaint  Patient presents with  . Medical Management of Chronic Issues    58yr followup, medication refills  . Gastroesophageal Reflux  . Anxiety  . Telephone Screen  . Telephone Assessment    VIDEO VISIT  . Quality Metric Gaps    colonoscopy due    The patient has been contacted via webcam for follow up visit due to concerns for spread of novel coronavirus. The patient is here to have refills on her routine medications. She has vey well controlled depression with dose of sertaline. Has many more good days than bad. She also suffers chronic allergies. She takes OTC Zyrtec every day and also takes singulair for which she needs a refills. Finally, she needs refill for her omeprazole.       Current Medication: Outpatient Encounter Medications as of 08/09/2018  Medication Sig  . Black Cohosh (BLACK COHOSH HOT FLASH RELIEF) 40 MG CAPS Take by mouth.  . DYMISTA 137-50 MCG/ACT SUSP 1 spray.  . levocetirizine (XYZAL) 5 MG tablet 5 mg.  . omeprazole (PRILOSEC) 10 MG capsule Take 1 capsule (10 mg total) by mouth daily.  . Probiotic Product (PROBIOTIC DAILY PO) Take by mouth.  . sertraline (ZOLOFT) 50 MG tablet Take 1 tablet (50 mg total) by mouth daily.  . [DISCONTINUED] montelukast (SINGULAIR) 10 MG tablet Take 1 tablet (10 mg total) by mouth at bedtime.   . [DISCONTINUED] montelukast (SINGULAIR) 10 MG tablet Take 1 tablet (10 mg total) by mouth at bedtime.  . [DISCONTINUED] omeprazole (PRILOSEC) 10 MG capsule Take 1 capsule (10 mg total) by mouth daily.  . [DISCONTINUED] sertraline (ZOLOFT) 50 MG tablet Take 1 tablet (50 mg total) by mouth daily.   Facility-Administered Encounter Medications as of 08/09/2018  Medication  . betamethasone acetate-betamethasone sodium phosphate (CELESTONE) injection 3 mg  . betamethasone acetate-betamethasone sodium phosphate (CELESTONE) injection 3 mg    Surgical History: Past Surgical History:  Procedure Laterality Date  . BRACHIOPLASTY Left   . BREAST BIOPSY Left 2011   neg  . FOOT SURGERY Left   . tummy tuck      Medical History: Past Medical History:  Diagnosis Date  . Anxiety   . GERD (gastroesophageal reflux disease)     Family History: Family History  Problem Relation Age of Onset  . COPD Mother   . Hypertension Father   . Hodgkin's lymphoma Brother   . Lung cancer Maternal Grandfather   . Breast cancer Paternal Grandmother     Social History   Socioeconomic History  . Marital status: Married    Spouse name: Not on file  . Number of children: Not on file  . Years of education: Not on file  . Highest education level: Not on file  Occupational History  . Not on file  Social Needs  . Financial resource strain: Not on file  . Food insecurity:  Worry: Not on file    Inability: Not on file  . Transportation needs:    Medical: Not on file    Non-medical: Not on file  Tobacco Use  . Smoking status: Former Games developer  . Smokeless tobacco: Never Used  Substance and Sexual Activity  . Alcohol use: Never    Frequency: Never  . Drug use: Never  . Sexual activity: Yes  Lifestyle  . Physical activity:    Days per week: Not on file    Minutes per session: Not on file  . Stress: Not on file  Relationships  . Social connections:    Talks on phone: Not on file    Gets together:  Not on file    Attends religious service: Not on file    Active member of club or organization: Not on file    Attends meetings of clubs or organizations: Not on file    Relationship status: Not on file  . Intimate partner violence:    Fear of current or ex partner: Not on file    Emotionally abused: Not on file    Physically abused: Not on file    Forced sexual activity: Not on file  Other Topics Concern  . Not on file  Social History Narrative  . Not on file      Review of Systems  Constitutional: Negative for activity change, chills, fatigue and unexpected weight change.       Patient has lost over 150 pounds over the past 1 to 2 years.   HENT: Negative for congestion, postnasal drip, rhinorrhea, sneezing and sore throat.   Respiratory: Negative for cough, chest tightness, shortness of breath and wheezing.   Cardiovascular: Negative for chest pain and palpitations.  Gastrointestinal: Negative for abdominal pain, constipation, diarrhea, nausea and vomiting.  Endocrine: Negative for cold intolerance, heat intolerance, polydipsia and polyuria.  Musculoskeletal: Negative for arthralgias, back pain, joint swelling and neck pain.  Skin: Negative for rash.  Allergic/Immunologic: Positive for environmental allergies.  Neurological: Negative for dizziness, tremors, numbness and headaches.  Hematological: Negative for adenopathy. Does not bruise/bleed easily.  Psychiatric/Behavioral: Negative for behavioral problems (Depression), sleep disturbance and suicidal ideas. The patient is not nervous/anxious.     Vital Signs: There were no vitals taken for this visit.   Observation/Objective:   The patient is alert and oriented. She is pleasant and answers all questions appropriately. Breathing is non-labored. She is in no acute distress at this time.    Assessment/Plan: 1. Gastroesophageal reflux disease without esophagitis Continue omeprazole as prescribed. Refills provided today.   - omeprazole (PRILOSEC) 10 MG capsule; Take 1 capsule (10 mg total) by mouth daily.  Dispense: 90 capsule; Refill: 3  2. Allergic rhinitis due to pollen, unspecified seasonality Continue xyzal, singulair, and nasal spray as prescribed. Refills provided today.   3. Recurrent major depressive disorder, in full remission (HCC) Well contorlled. Continue zoloft as prescribed. Refills provided today.  - sertraline (ZOLOFT) 50 MG tablet; Take 1 tablet (50 mg total) by mouth daily.  Dispense: 90 tablet; Refill: 3  General Counseling: Zailey verbalizes understanding of the findings of today's phone visit and agrees with plan of treatment. I have discussed any further diagnostic evaluation that may be needed or ordered today. We also reviewed her medications today. she has been encouraged to call the office with any questions or concerns that should arise related to todays visit.  This patient was seen by Vincent Gros FNP Collaboration with Dr Lyndon Code as  a part of collaborative care agreement  Meds ordered this encounter  Medications  . DISCONTD: montelukast (SINGULAIR) 10 MG tablet    Sig: Take 1 tablet (10 mg total) by mouth at bedtime.    Dispense:  90 tablet    Refill:  3    Order Specific Question:   Supervising Provider    Answer:   Lyndon CodeKHAN, FOZIA M [[id number]]  . omeprazole (PRILOSEC) 10 MG capsule    Sig: Take 1 capsule (10 mg total) by mouth daily.    Dispense:  90 capsule    Refill:  3    Order Specific Question:   Supervising Provider    Answer:   Lyndon CodeKHAN, FOZIA M [[id number]]  . sertraline (ZOLOFT) 50 MG tablet    Sig: Take 1 tablet (50 mg total) by mouth daily.    Dispense:  90 tablet    Refill:  3    Order Specific Question:   Supervising Provider    Answer:   Lyndon CodeKHAN, FOZIA M [[id number]]    Time spent: 5315 Minutes    Dr Lyndon CodeFozia M Khan Internal medicine

## 2018-08-10 ENCOUNTER — Other Ambulatory Visit: Payer: Self-pay

## 2018-08-10 DIAGNOSIS — J301 Allergic rhinitis due to pollen: Secondary | ICD-10-CM

## 2018-08-10 MED ORDER — MONTELUKAST SODIUM 10 MG PO TABS: 10.0000 mg | ORAL_TABLET | Freq: Every day | ORAL | 1 refills | Status: DC

## 2018-08-20 ENCOUNTER — Other Ambulatory Visit: Payer: Self-pay

## 2018-08-20 MED ORDER — MELOXICAM 7.5 MG PO TABS: 7.5000 mg | ORAL_TABLET | Freq: Every day | ORAL | 3 refills | Status: DC

## 2018-08-20 NOTE — Telephone Encounter (Signed)
Pt called she forgot to mention she having tendonitis in right wrist on and off as per heather send meloxicam she was on before

## 2018-12-31 ENCOUNTER — Other Ambulatory Visit: Payer: Self-pay

## 2018-12-31 MED ORDER — MELOXICAM 7.5 MG PO TABS: 7.5000 mg | ORAL_TABLET | Freq: Every day | ORAL | 3 refills | Status: DC

## 2019-01-09 ENCOUNTER — Other Ambulatory Visit: Payer: Self-pay | Admitting: Nurse Practitioner

## 2019-01-09 DIAGNOSIS — Z1231 Encounter for screening mammogram for malignant neoplasm of breast: Secondary | ICD-10-CM

## 2019-01-31 ENCOUNTER — Ambulatory Visit: Payer: Managed Care, Other (non HMO) | Admitting: Certified Nurse Midwife

## 2019-02-12 ENCOUNTER — Other Ambulatory Visit: Payer: Self-pay

## 2019-02-12 ENCOUNTER — Encounter: Payer: Self-pay | Admitting: Nurse Practitioner

## 2019-02-12 ENCOUNTER — Ambulatory Visit (INDEPENDENT_AMBULATORY_CARE_PROVIDER_SITE_OTHER): Payer: Managed Care, Other (non HMO) | Admitting: Nurse Practitioner

## 2019-02-12 VITALS — BP 112/66 | HR 60 | Temp 97.7°F | Resp 16 | Ht 62.0 in | Wt 183.0 lb

## 2019-02-12 DIAGNOSIS — J301 Allergic rhinitis due to pollen: Secondary | ICD-10-CM

## 2019-02-12 DIAGNOSIS — Z23 Encounter for immunization: Secondary | ICD-10-CM | POA: Diagnosis not present

## 2019-02-12 DIAGNOSIS — K219 Gastro-esophageal reflux disease without esophagitis: Secondary | ICD-10-CM

## 2019-02-12 DIAGNOSIS — R300 Dysuria: Secondary | ICD-10-CM

## 2019-02-12 DIAGNOSIS — Z0001 Encounter for general adult medical examination with abnormal findings: Secondary | ICD-10-CM | POA: Diagnosis not present

## 2019-02-12 DIAGNOSIS — N390 Urinary tract infection, site not specified: Secondary | ICD-10-CM

## 2019-02-12 DIAGNOSIS — Z1211 Encounter for screening for malignant neoplasm of colon: Secondary | ICD-10-CM

## 2019-02-12 NOTE — Progress Notes (Signed)
[facility] [place], [place] [address]  Internal MEDICINE  Office Visit Note  Patient Name: Jennifer Branch  [id number]  [id number]  Date of Service: 02/28/2019   Pt is here for routine health maintenance examination  Chief Complaint  Patient presents with  . Annual Exam  . Gynecologic Exam  . Gastroesophageal Reflux  . Quality Metric Gaps    colonoscopy     The patient is here for health maintenance exam. She had CPE with pap smear in 01/2018 with GYN provider last year. It was negative for lesions or evidence of malignancy. HPV was also negative. She is due for routine, fasting blood work. Mammogram is scheduled for next month. She is due to have screening colonoscopy. She states that she has has had some fatigue recently. She also has a palpable left inguinal hernia. This is not painful and does not bother her at all.     Current Medication: Outpatient Encounter Medications as of 02/12/2019  Medication Sig  . Black Cohosh (BLACK COHOSH HOT FLASH RELIEF) 40 MG CAPS Take by mouth.  . DYMISTA 137-50 MCG/ACT SUSP 1 spray.  . levocetirizine (XYZAL) 5 MG tablet 5 mg.  . meloxicam (MOBIC) 7.5 MG tablet Take 1 tablet (7.5 mg total) by mouth daily.  . montelukast (SINGULAIR) 10 MG tablet Take 1 tablet (10 mg total) by mouth at bedtime.  Marland Kitchen omeprazole (PRILOSEC) 10 MG capsule Take 1 capsule (10 mg total) by mouth daily.  . Probiotic Product (PROBIOTIC DAILY PO) Take by mouth.  . sertraline (ZOLOFT) 50 MG tablet Take 1 tablet (50 mg total) by mouth daily.  Marland Kitchen sulfamethoxazole-trimethoprim (BACTRIM DS) 800-160 MG tablet Take 1 tablet by mouth 2 (two) times daily.   Facility-Administered Encounter Medications as of 02/12/2019  Medication  . betamethasone acetate-betamethasone sodium phosphate (CELESTONE) injection 3 mg  . betamethasone acetate-betamethasone sodium phosphate (CELESTONE) injection 3 mg    Surgical History: Past Surgical History:  Procedure  Laterality Date  . BRACHIOPLASTY Left   . BREAST BIOPSY Left 2011   neg  . FOOT SURGERY Left   . tummy tuck      Medical History: Past Medical History:  Diagnosis Date  . Anxiety   . GERD (gastroesophageal reflux disease)     Family History: Family History  Problem Relation Age of Onset  . COPD Mother   . Hypertension Father   . Hodgkin's lymphoma Brother   . Lung cancer Maternal Grandfather   . Breast cancer Paternal Grandmother       Review of Systems  Constitutional: Negative for activity change, chills, fatigue and unexpected weight change.  HENT: Negative for congestion, postnasal drip, rhinorrhea, sneezing and sore throat.   Respiratory: Negative for cough, chest tightness, shortness of breath and wheezing.   Cardiovascular: Negative for chest pain and palpitations.  Gastrointestinal: Negative for abdominal pain, constipation, diarrhea, nausea and vomiting.  Endocrine: Negative for cold intolerance, heat intolerance, polydipsia and polyuria.  Genitourinary: Negative for dysuria, frequency, hematuria and urgency.  Musculoskeletal: Negative for arthralgias, back pain, joint swelling and neck pain.  Skin: Negative for rash.  Allergic/Immunologic: Negative for environmental allergies.  Neurological: Negative for dizziness, tremors, numbness and headaches.  Hematological: Negative for adenopathy. Does not bruise/bleed easily.  Psychiatric/Behavioral: Positive for dysphoric mood. Negative for behavioral problems (Depression), sleep disturbance and suicidal ideas. The patient is not nervous/anxious.        Mild depression which is well controlled.    Today's Vitals   02/12/19 1504  BP: 112/66  Pulse: 60  Resp: 16  Temp: 97.7 F (36.5 C)  SpO2: 98%  Weight: 183 lb (83 kg)  Height:  (1.575 m)   Body mass index is 33.47 kg/m.  Physical Exam Vitals signs and nursing note reviewed.  Constitutional:      General: She is not in acute distress.    Appearance:  Normal appearance. She is well-developed. She is not diaphoretic.  HENT:     Head: Normocephalic and atraumatic.     Nose: Nose normal.     Mouth/Throat:     Pharynx: No oropharyngeal exudate.  Eyes:     Extraocular Movements: Extraocular movements intact.     Pupils: Pupils are equal, round, and reactive to light.  Neck:     Musculoskeletal: Normal range of motion and neck supple.     Thyroid: No thyromegaly.     Vascular: No JVD.     Trachea: No tracheal deviation.  Cardiovascular:     Rate and Rhythm: Normal rate and regular rhythm.     Pulses: Normal pulses.     Heart sounds: Normal heart sounds. No murmur. No friction rub. No gallop.   Pulmonary:     Effort: Pulmonary effort is normal. No respiratory distress.     Breath sounds: Normal breath sounds. No wheezing or rales.  Chest:     Chest wall: No tenderness.  Abdominal:     General: Bowel sounds are normal.     Palpations: Abdomen is soft.     Tenderness: There is no abdominal tenderness.  Musculoskeletal: Normal range of motion.  Lymphadenopathy:     Cervical: No cervical adenopathy.  Skin:    General: Skin is warm and dry.  Neurological:     Mental Status: She is alert and oriented to person, place, and time.     Cranial Nerves: No cranial nerve deficit.  Psychiatric:        Mood and Affect: Mood normal.        Behavior: Behavior normal.        Thought Content: Thought content normal.        Judgment: Judgment normal.      LABS: Recent Results (from the past 2160 hour(s))  UA/M w/rflx Culture, Routine     Status: Abnormal   Collection Time: 02/12/19  3:00 PM   Specimen: Urine   URINE  Result Value Ref Range   Specific Gravity, UA 1.006 1.005 - 1.030   pH, UA 7.0 5.0 - 7.5   Color, UA Yellow Yellow   Appearance Ur Clear Clear   Leukocytes,UA Trace (A) Negative   Protein,UA Negative Negative/Trace   Glucose, UA Negative Negative   Ketones, UA Negative Negative   RBC, UA Negative Negative   Bilirubin,  UA Negative Negative   Urobilinogen, Ur 0.2 0.2 - 1.0 mg/dL   Nitrite, UA Negative Negative   Microscopic Examination See below:     Comment: Microscopic was indicated and was performed.   Urinalysis Reflex Comment     Comment: This specimen has reflexed to a Urine Culture.  Microscopic Examination     Status: None   Collection Time: 02/12/19  3:00 PM   URINE  Result Value Ref Range   WBC, UA 0-5 0 - 5 /hpf   RBC 0-2 0 - 2 /hpf   Epithelial Cells (non renal) 0-10 0 - 10 /hpf   Casts None seen None seen /lpf   Bacteria, UA Few None seen/Few  Urine Culture, Reflex  Status: Abnormal   Collection Time: 02/12/19  3:00 PM   URINE  Result Value Ref Range   Urine Culture, Routine Final report (A)    Organism ID, Bacteria Escherichia coli (A)     Comment: 25,000-50,000 colony forming units per mL Cefazolin <=4 ug/mL Cefazolin with an MIC <=16 predicts susceptibility to the oral agents cefaclor, cefdinir, cefpodoxime, cefprozil, cefuroxime, cephalexin, and loracarbef when used for therapy of uncomplicated urinary tract infections due to E. coli, Klebsiella pneumoniae, and Proteus mirabilis.    ORGANISM ID, BACTERIA Comment     Comment: Mixed urogenital flora 10,000-25,000 colony forming units per mL    Antimicrobial Susceptibility Comment     Comment:       ** S = Susceptible; I = Intermediate; R = Resistant **                    P = Positive; N = Negative             MICS are expressed in micrograms per mL    Antibiotic                 RSLT#1    RSLT#2    RSLT#3    RSLT#4 Amoxicillin/Clavulanic Acid    S Ampicillin                     S Cefepime                       S Ceftriaxone                    S Cefuroxime                     S Ciprofloxacin                  S Ertapenem                      S Gentamicin                     S Imipenem                       S Levofloxacin                   S Meropenem                      S Nitrofurantoin                  S Piperacillin/Tazobactam        S Tetracycline                   S Tobramycin                     S Trimethoprim/Sulfa             S   SARS CORONAVIRUS 2 (TAT 6-24 HRS) Nasopharyngeal Nasopharyngeal Swab     Status: None   Collection Time: 02/25/19 10:43 AM   Specimen: Nasopharyngeal Swab  Result Value Ref Range   SARS Coronavirus 2 NEGATIVE NEGATIVE    Comment: (NOTE) SARS-CoV-2 target nucleic acids are NOT DETECTED. The SARS-CoV-2 RNA is generally detectable in upper and lower respiratory specimens during the acute phase of infection. Negative results do not preclude SARS-CoV-2 infection,  do not rule out co-infections with other pathogens, and should not be used as the sole basis for treatment or other patient management decisions. Negative results must be combined with clinical observations, patient history, and epidemiological information. The expected result is Negative. Fact Sheet for Patients: HairSlick.nohttps://www.fda.gov/media/138098/download Fact Sheet for Healthcare Providers: quierodirigir.comhttps://www.fda.gov/media/138095/download This test is not yet approved or cleared by the [place] FDA and  has been authorized for detection and/or diagnosis of SARS-CoV-2 by FDA under an Emergency Use Authorization (EUA). This EUA will remain  in effect (meaning this test can be used) for the duration of the COVID-19 declaration under Section 56 4(b)(1) of the Act, 21 U.S.C. section 360bbb-3(b)(1), unless the authorization is terminated or revoked sooner. Performed at [facility], [address] [place], [place] [address]     Assessment/Plan: 1. Encounter for general adult medical examination with abnormal findings Annual wellness visit today. Well woman exam with breast exam and pap smear done per GYN provider.   2. Allergic rhinitis due to pollen, unspecified seasonality conitnue all allergy medications nd nasal sprays as prescribed   3. Gastroesophageal reflux disease without  esophagitis Use omeprazole as needed and as prescribed   4. Flu vaccine need - Flu Vaccine MDCK QUAD PF  5. Screening for colon cancer Refer to GI for screeing colonoscopy. - Ambulatory referral to Gastroenterology  6. Urinary tract infection without hematuria, site unspecified Asymptomatic. Start bactrim DS  Twice daily for 7 days.  - sulfamethoxazole-trimethoprim (BACTRIM DS) 800-160 MG tablet; Take 1 tablet by mouth 2 (two) times daily.  Dispense: 14 tablet; Refill: 0  7. Dysuria - UA/M w/rflx Culture, Routine  General Counseling: French Anaracy verbalizes understanding of the findings of todays visit and agrees with plan of treatment. I have discussed any further diagnostic evaluation that may be needed or ordered today. We also reviewed her medications today. she has been encouraged to call the office with any questions or concerns that should arise related to todays visit.    Counseling:  This patient was seen by Vincent GrosHeather Dhani Dannemiller FNP Collaboration with Dr Lyndon CodeFozia M Khan as a part of collaborative care agreement  Orders Placed This Encounter  Procedures  . Microscopic Examination  . Urine Culture, Reflex  . Flu Vaccine MDCK QUAD PF  . UA/M w/rflx Culture, Routine  . Ambulatory referral to Gastroenterology    Meds ordered this encounter  Medications  . sulfamethoxazole-trimethoprim (BACTRIM DS) 800-160 MG tablet    Sig: Take 1 tablet by mouth 2 (two) times daily.    Dispense:  14 tablet    Refill:  0    Order Specific Question:   Supervising Provider    Answer:   Lyndon CodeKHAN, FOZIA M [[id number]]    Time spent: 2330 Minutes      Lyndon CodeFozia M Khan, MD  Internal Medicine

## 2019-02-13 ENCOUNTER — Telehealth: Payer: Self-pay

## 2019-02-13 ENCOUNTER — Other Ambulatory Visit: Payer: Self-pay

## 2019-02-13 DIAGNOSIS — Z1211 Encounter for screening for malignant neoplasm of colon: Secondary | ICD-10-CM

## 2019-02-13 NOTE — Progress Notes (Signed)
Wait for culture results to treat uti

## 2019-02-13 NOTE — Telephone Encounter (Signed)
Gastroenterology Pre-Procedure Review  Request Date: Thursday 02/28/19 Requesting Physician: Dr. Vicente Males  PATIENT REVIEW QUESTIONS: The patient responded to the following health history questions as indicated:    1. Are you having any GI issues? no 2. Do you have a personal history of Polyps? no 3. Do you have a family history of Colon Cancer or Polyps? yes (father colon polyps) 4. Diabetes Mellitus? no 5. Joint replacements in the past 12 months?no 6. Major health problems in the past 3 months?no 7. Any artificial heart valves, MVP, or defibrillator?no    MEDICATIONS & ALLERGIES:    Patient reports the following regarding taking any anticoagulation/antiplatelet therapy:   Plavix, Coumadin, Eliquis, Xarelto, Lovenox, Pradaxa, Brilinta, or Effient? no Aspirin? no  Patient confirms/reports the following medications:  Current Outpatient Medications  Medication Sig Dispense Refill  . Black Cohosh (BLACK COHOSH HOT FLASH RELIEF) 40 MG CAPS Take by mouth.    . DYMISTA 137-50 MCG/ACT SUSP 1 spray.    . levocetirizine (XYZAL) 5 MG tablet 5 mg.    . meloxicam (MOBIC) 7.5 MG tablet Take 1 tablet (7.5 mg total) by mouth daily. 30 tablet 3  . montelukast (SINGULAIR) 10 MG tablet Take 1 tablet (10 mg total) by mouth at bedtime. 90 tablet 1  . omeprazole (PRILOSEC) 10 MG capsule Take 1 capsule (10 mg total) by mouth daily. 90 capsule 3  . Probiotic Product (PROBIOTIC DAILY PO) Take by mouth.    . sertraline (ZOLOFT) 50 MG tablet Take 1 tablet (50 mg total) by mouth daily. 90 tablet 3   Current Facility-Administered Medications  Medication Dose Route Frequency Provider Last Rate Last Dose  . betamethasone acetate-betamethasone sodium phosphate (CELESTONE) injection 3 mg  3 mg Intramuscular Once Daylene Katayama M, DPM      . betamethasone acetate-betamethasone sodium phosphate (CELESTONE) injection 3 mg  3 mg Intramuscular Once Edrick Kins, DPM        Patient confirms/reports the following  allergies:  Allergies  Allergen Reactions  . Crab [Shellfish Allergy] Swelling and Other (See Comments)    Crab only no issues with any other shell fish happened when a child not certain of details  . Penicillins     Does not recall reaction happened when she was a child     No orders of the defined types were placed in this encounter.   AUTHORIZATION INFORMATION Primary Insurance: 1D#: Group #:  Secondary Insurance: 1D#: Group #:  SCHEDULE INFORMATION: Date: Thursday 02/28/19 Time: Location:[facility]

## 2019-02-15 LAB — UA/M W/RFLX CULTURE, ROUTINE
Bilirubin, UA: NEGATIVE
Glucose, UA: NEGATIVE
Ketones, UA: NEGATIVE
Nitrite, UA: NEGATIVE
Protein,UA: NEGATIVE
RBC, UA: NEGATIVE
Specific Gravity, UA: 1.006 (ref 1.005–1.030)
Urobilinogen, Ur: 0.2 mg/dL (ref 0.2–1.0)
pH, UA: 7 (ref 5.0–7.5)

## 2019-02-15 LAB — URINE CULTURE, REFLEX

## 2019-02-15 LAB — MICROSCOPIC EXAMINATION: Casts: NONE SEEN /lpf

## 2019-02-17 MED ORDER — SULFAMETHOXAZOLE-TRIMETHOPRIM 800-160 MG PO TABS: 1.0000 | ORAL_TABLET | Freq: Two times a day (BID) | ORAL | 0 refills | Status: DC

## 2019-02-17 NOTE — Progress Notes (Signed)
Please let the patient know that urine sample at cpe showed infection. Will treat with bactrim ds bid for 7 days. Sent prescription to [facility] on [address]. Thanks.

## 2019-02-18 ENCOUNTER — Telehealth: Payer: Self-pay

## 2019-02-18 NOTE — Telephone Encounter (Signed)
-----   Message from Ronnell Freshwater, NP sent at 02/17/2019  6:49 PM EST ----- Please let the patient know that urine sample at cpe showed infection. Will treat with bactrim ds bid for 7 days. Sent prescription to [facility] on [address]. Thanks.

## 2019-02-18 NOTE — Progress Notes (Signed)
lmom to call us back

## 2019-02-18 NOTE — Telephone Encounter (Signed)
Pt advised for urine showed UTI we send bactrim/np

## 2019-02-22 ENCOUNTER — Ambulatory Visit: Payer: Managed Care, Other (non HMO) | Admitting: Certified Nurse Midwife

## 2019-02-25 ENCOUNTER — Other Ambulatory Visit: Payer: Self-pay

## 2019-02-25 ENCOUNTER — Other Ambulatory Visit
Admission: RE | Admit: 2019-02-25 | Discharge: 2019-02-25 | Disposition: A | Discharge status: Home / Self Care | Payer: Managed Care, Other (non HMO) | Source: Ambulatory Visit | Attending: Gastroenterology | Admitting: Gastroenterology

## 2019-02-25 DIAGNOSIS — Z01812 Encounter for preprocedural laboratory examination: Secondary | ICD-10-CM | POA: Diagnosis present

## 2019-02-25 DIAGNOSIS — Z20828 Contact with and (suspected) exposure to other viral communicable diseases: Secondary | ICD-10-CM | POA: Diagnosis not present

## 2019-02-25 LAB — SARS CORONAVIRUS 2 (TAT 6-24 HRS): SARS Coronavirus 2: NEGATIVE

## 2019-02-27 ENCOUNTER — Other Ambulatory Visit: Payer: Self-pay | Admitting: Nurse Practitioner

## 2019-02-28 ENCOUNTER — Encounter
Admission: RE | Disposition: A | Discharge status: Home / Self Care | Payer: Self-pay | Source: Home / Self Care | Attending: Gastroenterology

## 2019-02-28 ENCOUNTER — Other Ambulatory Visit: Payer: Self-pay

## 2019-02-28 ENCOUNTER — Ambulatory Visit
Admission: RE | Admit: 2019-02-28 | Discharge: 2019-02-28 | Disposition: A | Discharge status: Home / Self Care | Payer: Managed Care, Other (non HMO) | Attending: Gastroenterology | Admitting: Gastroenterology

## 2019-02-28 DIAGNOSIS — R300 Dysuria: Secondary | ICD-10-CM | POA: Insufficient documentation

## 2019-02-28 DIAGNOSIS — Z23 Encounter for immunization: Secondary | ICD-10-CM | POA: Insufficient documentation

## 2019-02-28 DIAGNOSIS — Z0001 Encounter for general adult medical examination with abnormal findings: Secondary | ICD-10-CM | POA: Insufficient documentation

## 2019-02-28 DIAGNOSIS — N390 Urinary tract infection, site not specified: Secondary | ICD-10-CM | POA: Insufficient documentation

## 2019-02-28 DIAGNOSIS — Z1211 Encounter for screening for malignant neoplasm of colon: Secondary | ICD-10-CM | POA: Insufficient documentation

## 2019-02-28 LAB — IRON AND TIBC
Iron Saturation: 25 % (ref 15–55)
Iron: 72 ug/dL (ref 27–159)
Total Iron Binding Capacity: 284 ug/dL (ref 250–450)
UIBC: 212 ug/dL (ref 131–425)

## 2019-02-28 LAB — FERRITIN: Ferritin: 65 ng/mL (ref 15–150)

## 2019-02-28 LAB — COMPREHENSIVE METABOLIC PANEL
ALT: 23 IU/L (ref 0–32)
AST: 28 IU/L (ref 0–40)
Albumin/Globulin Ratio: 2 (ref 1.2–2.2)
Albumin: 4.3 g/dL (ref 3.8–4.9)
Alkaline Phosphatase: 47 IU/L (ref 39–117)
BUN/Creatinine Ratio: 17 (ref 9–23)
BUN: 16 mg/dL (ref 6–24)
Bilirubin Total: 0.3 mg/dL (ref 0.0–1.2)
CO2: 23 mmol/L (ref 20–29)
Calcium: 9.1 mg/dL (ref 8.7–10.2)
Chloride: 103 mmol/L (ref 96–106)
Creatinine, Ser: 0.95 mg/dL (ref 0.57–1.00)
GFR calc Af Amer: 79 mL/min/{1.73_m2} (ref >59)
GFR calc non Af Amer: 69 mL/min/{1.73_m2} (ref >59)
Globulin, Total: 2.2 g/dL (ref 1.5–4.5)
Glucose: 78 mg/dL (ref 65–99)
Potassium: 4.2 mmol/L (ref 3.5–5.2)
Sodium: 138 mmol/L (ref 134–144)
Total Protein: 6.5 g/dL (ref 6.0–8.5)

## 2019-02-28 LAB — LIPID PANEL W/O CHOL/HDL RATIO
Cholesterol, Total: 205 mg/dL — ABNORMAL HIGH (ref 100–199)
HDL: 88 mg/dL (ref >39)
LDL Chol Calc (NIH): 110 mg/dL — ABNORMAL HIGH (ref 0–99)
Triglycerides: 39 mg/dL (ref 0–149)
VLDL Cholesterol Cal: 7 mg/dL (ref 5–40)

## 2019-02-28 LAB — VITAMIN D 25 HYDROXY (VIT D DEFICIENCY, FRACTURES): Vit D, 25-Hydroxy: 26.9 ng/mL — ABNORMAL LOW (ref 30.0–100.0)

## 2019-02-28 LAB — TSH: TSH: 1.11 u[IU]/mL (ref 0.450–4.500)

## 2019-02-28 LAB — CBC
Hematocrit: 36.7 % (ref 34.0–46.6)
Hemoglobin: 12.5 g/dL (ref 11.1–15.9)
MCH: 31.1 pg (ref 26.6–33.0)
MCHC: 34.1 g/dL (ref 31.5–35.7)
MCV: 91 fL (ref 79–97)
Platelets: 212 10*3/uL (ref 150–450)
RBC: 4.02 x10E6/uL (ref 3.77–5.28)
RDW: 12.1 % (ref 11.7–15.4)
WBC: 4.8 10*3/uL (ref 3.4–10.8)

## 2019-02-28 LAB — T4, FREE: Free T4: 1.12 ng/dL (ref 0.82–1.77)

## 2019-02-28 LAB — B12 AND FOLATE PANEL
Folate: 12.4 ng/mL (ref >3.0)
Vitamin B-12: 1014 pg/mL (ref 232–1245)

## 2019-02-28 LAB — HGB A1C W/O EAG: Hgb A1c MFr Bld: 4.9 % (ref 4.8–5.6)

## 2019-02-28 SURGERY — COLONOSCOPY WITH PROPOFOL
Anesthesia: General

## 2019-02-28 MED ORDER — SODIUM CHLORIDE 0.9 % IV SOLN: INTRAVENOUS | Status: DC

## 2019-02-28 NOTE — Progress Notes (Signed)
Patient not clean wants to reschedule 

## 2019-02-28 NOTE — Progress Notes (Signed)
Mild elevation o total and LDL cholesterol. Mild low vitamin d. Other labs good.

## 2019-03-26 ENCOUNTER — Telehealth: Payer: Self-pay

## 2019-03-26 NOTE — Telephone Encounter (Signed)
Confirmed appointment with patient. klh °

## 2019-03-27 ENCOUNTER — Ambulatory Visit: Payer: Managed Care, Other (non HMO) | Admitting: Adult Health

## 2019-03-27 ENCOUNTER — Other Ambulatory Visit: Payer: Self-pay

## 2019-03-27 ENCOUNTER — Encounter: Payer: Self-pay | Admitting: Adult Health

## 2019-03-27 VITALS — BP 128/53 | HR 55 | Resp 16 | Ht 62.0 in | Wt 175.4 lb

## 2019-03-27 DIAGNOSIS — N3001 Acute cystitis with hematuria: Secondary | ICD-10-CM

## 2019-03-27 DIAGNOSIS — R300 Dysuria: Secondary | ICD-10-CM

## 2019-03-27 LAB — POCT URINALYSIS DIPSTICK
Bilirubin, UA: NEGATIVE
Glucose, UA: NEGATIVE
Ketones, UA: NEGATIVE
Nitrite, UA: POSITIVE
Protein, UA: NEGATIVE
Spec Grav, UA: <=1.005 — AB (ref 1.010–1.025)
Urobilinogen, UA: 0.2 E.U./dL
pH, UA: 5 (ref 5.0–8.0)

## 2019-03-27 MED ORDER — NITROFURANTOIN MONOHYD MACRO 100 MG PO CAPS: 100.0000 mg | ORAL_CAPSULE | Freq: Two times a day (BID) | ORAL | 0 refills | Status: DC

## 2019-03-27 NOTE — Progress Notes (Signed)
[facility] [place], [place] [address]  Internal MEDICINE  Office Visit Note  Patient Name: Jennifer Branch  [id number]  [id number]  Date of Service: 03/29/2019  Chief Complaint  Patient presents with  . Dysuria    burning, cramping, urinary frequency      HPI Pt is here for a sick visit. Patient is here with complaints of lower abdominal pain, burning with urination and increased frequency. Symptoms started 2 days ago but worsened yesterday. Denies fevers or flank pain. Was treated for asymptomatic UTI at the end of October with Bactrim. 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 Discussed with her to change out of workout clothing as moisture is trapped in workout clothing and to change into cotton underwear and pants. Denies changing soap or detergent.   Current Medication:  Outpatient Encounter Medications as of 03/27/2019  Medication Sig  . Black Cohosh (BLACK COHOSH HOT FLASH RELIEF) 40 MG CAPS Take by mouth.  . DYMISTA 137-50 MCG/ACT SUSP 1 spray.  . levocetirizine (XYZAL) 5 MG tablet 5 mg.  . meloxicam (MOBIC) 7.5 MG tablet Take 1 tablet (7.5 mg total) by mouth daily.  . montelukast (SINGULAIR) 10 MG tablet Take 1 tablet (10 mg total) by mouth at bedtime.  Marland Kitchen omeprazole (PRILOSEC) 10 MG capsule Take 1 capsule (10 mg total) by mouth daily.  . Probiotic Product (PROBIOTIC DAILY PO) Take by mouth.  . sertraline (ZOLOFT) 50 MG tablet Take 1 tablet (50 mg total) by mouth daily.  . nitrofurantoin, macrocrystal-monohydrate, (MACROBID) 100 MG capsule Take 1 capsule (100 mg total) by mouth 2 (two) times daily.  . [DISCONTINUED] sulfamethoxazole-trimethoprim (BACTRIM DS) 800-160 MG tablet Take 1 tablet by mouth 2 (two) times daily. (Patient not taking: Reported on 03/27/2019)   Facility-Administered Encounter Medications as of 03/27/2019  Medication  .  betamethasone acetate-betamethasone sodium phosphate (CELESTONE) injection 3 mg  . betamethasone acetate-betamethasone sodium phosphate (CELESTONE) injection 3 mg      Medical History: Past Medical History:  Diagnosis Date  . Anxiety   . GERD (gastroesophageal reflux disease)      Vital Signs: BP (!) 128/53   Pulse (!) 55   Resp 16   Ht 5\' 2"  (1.575 m)   Wt 175 lb 6.4 oz (79.6 kg)   SpO2 96%   BMI 32.08 kg/m    Review of Systems  Constitutional: Negative for chills, fatigue and unexpected weight change.  HENT: Negative for congestion, rhinorrhea, sneezing and sore throat.   Eyes: Negative for photophobia, pain and redness.  Respiratory: Negative for cough, chest tightness and shortness of breath.   Cardiovascular: Negative for chest pain and palpitations.  Gastrointestinal: Negative for abdominal pain, constipation, diarrhea, nausea and vomiting.  Endocrine: Negative.   Genitourinary: Positive for dysuria, frequency and urgency.  Musculoskeletal: Negative for arthralgias, back pain, joint swelling and neck pain.  Skin: Negative for rash.  Allergic/Immunologic: Negative.   Neurological: Negative for tremors and numbness.  Hematological: Negative for adenopathy. Does not bruise/bleed easily.  Psychiatric/Behavioral: Negative for behavioral problems and sleep disturbance. The patient is not nervous/anxious.     Physical Exam Vitals signs and nursing note reviewed.  Constitutional:      General: She is not in acute distress.    Appearance: She is well-developed. She is not diaphoretic.  HENT:     Head: Normocephalic and atraumatic.  Mouth/Throat:     Pharynx: No oropharyngeal exudate.  Eyes:     Pupils: Pupils are equal, round, and reactive to light.  Neck:     Musculoskeletal: Normal range of motion and neck supple.     Thyroid: No thyromegaly.     Vascular: No JVD.     Trachea: No tracheal deviation.  Cardiovascular:     Rate and Rhythm: Normal rate and  regular rhythm.     Heart sounds: Normal heart sounds. No murmur. No friction rub. No gallop.   Pulmonary:     Effort: Pulmonary effort is normal. No respiratory distress.     Breath sounds: Normal breath sounds. No wheezing or rales.  Chest:     Chest wall: No tenderness.  Abdominal:     Palpations: Abdomen is soft.     Tenderness: There is no abdominal tenderness. There is no guarding.  Musculoskeletal: Normal range of motion.  Lymphadenopathy:     Cervical: No cervical adenopathy.  Skin:    General: Skin is warm and dry.  Neurological:     Mental Status: She is alert and oriented to person, place, and time.     Cranial Nerves: No cranial nerve deficit.  Psychiatric:        Behavior: Behavior normal.        Thought Content: Thought content normal.        Judgment: Judgment normal.    Assessment/Plan: 1.Acute cystitis with hematuria Advised patient to take entire course of antibiotics as prescribed with food. Pt should return to clinic in 7-10 days if symptoms fail to improve or new symptoms develop.  Discussed changing clothing habits. - POCT Urinalysis Dipstick - nitrofurantoin, macrocrystal-monohydrate, (MACROBID) 100 MG capsule; Take 1 capsule (100 mg total) by mouth 2 (two) times daily.  Dispense: 20 capsule; Refill: 0  2. Dysuria - CULTURE, URINE COMPREHENSIVE   General Counseling: Falesha verbalizes understanding of the findings of todays visit and agrees with plan of treatment. I have discussed any further diagnostic evaluation that may be needed or ordered today. We also reviewed her medications today. she has been encouraged to call the office with any questions or concerns that should arise related to todays visit.   Orders Placed This Encounter  Procedures  . CULTURE, URINE COMPREHENSIVE  . POCT Urinalysis Dipstick    Meds ordered this encounter  Medications  . nitrofurantoin, macrocrystal-monohydrate, (MACROBID) 100 MG capsule    Sig: Take 1 capsule (100 mg  total) by mouth 2 (two) times daily.    Dispense:  20 capsule    Refill:  0    Time spent: 20 Minutes  This patient was seen by Blima Ledger AGNP-C in Collaboration with Dr Lyndon Code as a part of collaborative care agreement.  Johnna Acosta AGNP-C Internal Medicine

## 2019-04-02 LAB — CULTURE, URINE COMPREHENSIVE

## 2019-04-09 NOTE — Progress Notes (Signed)
Repeat urine if possible

## 2019-05-20 ENCOUNTER — Encounter: Payer: Self-pay | Admitting: Adult Health

## 2019-05-20 ENCOUNTER — Ambulatory Visit: Payer: Managed Care, Other (non HMO) | Admitting: Adult Health

## 2019-05-20 VITALS — Ht 62.0 in | Wt 180.0 lb

## 2019-05-20 DIAGNOSIS — B373 Candidiasis of vulva and vagina: Secondary | ICD-10-CM

## 2019-05-20 DIAGNOSIS — J0100 Acute maxillary sinusitis, unspecified: Secondary | ICD-10-CM | POA: Diagnosis not present

## 2019-05-20 DIAGNOSIS — N3000 Acute cystitis without hematuria: Secondary | ICD-10-CM

## 2019-05-20 DIAGNOSIS — B3731 Acute candidiasis of vulva and vagina: Secondary | ICD-10-CM

## 2019-05-20 MED ORDER — FLUCONAZOLE 150 MG PO TABS: 150.0000 mg | ORAL_TABLET | ORAL | 0 refills | Status: DC | PRN

## 2019-05-20 MED ORDER — SULFAMETHOXAZOLE-TRIMETHOPRIM 400-80 MG PO TABS: 1.0000 | ORAL_TABLET | Freq: Two times a day (BID) | ORAL | 0 refills | Status: DC

## 2019-05-20 NOTE — Progress Notes (Signed)
[facility] [address] [place], [place] [address]  Internal MEDICINE  Telephone Visit  Patient Name: Jennifer Branch  [id number]  [id number]  Date of Service: 05/20/2019  I connected with the patient at 909 by telephone and verified the patients identity using two identifiers.   I discussed the limitations, risks, security and privacy concerns of performing an evaluation and management service by telephone and the availability of in person appointments. I also discussed with the patient that there may be a patient responsible charge related to the service.  The patient expressed understanding and agrees to proceed.    Chief Complaint  Patient presents with  . Telephone Assessment    S  . Urinary Tract Infection  . Dizziness  . Ear Pain    LEFT   . Headache  . Telephone Screen  . Sinusitis    HPI  Pt seen via video. Pt seen today for complaints of urinary frequency, and low back pain.  She also reports some headaches, PND, sinus pressure and pain.  She has had a few moments of dizziness when turning her head. She had a UTI about 2 months ago.  She was treated appropriately per culture results. Pt does report taking a lot of tub baths.  We discussed stopping this for awhile.    Current Medication: Outpatient Encounter Medications as of 05/20/2019  Medication Sig  . Black Cohosh (BLACK COHOSH HOT FLASH RELIEF) 40 MG CAPS Take by mouth.  . DYMISTA 137-50 MCG/ACT SUSP 1 spray.  . levocetirizine (XYZAL) 5 MG tablet 5 mg.  . meloxicam (MOBIC) 7.5 MG tablet Take 1 tablet (7.5 mg total) by mouth daily.  . montelukast (SINGULAIR) 10 MG tablet Take 1 tablet (10 mg total) by mouth at bedtime.  . nitrofurantoin, macrocrystal-monohydrate, (MACROBID) 100 MG capsule Take 1 capsule (100 mg total) by mouth 2 (two) times daily.  Marland Kitchen omeprazole (PRILOSEC) 10 MG capsule Take 1 capsule (10 mg total) by mouth daily.  . Probiotic Product (PROBIOTIC DAILY PO) Take by mouth.  . sertraline  (ZOLOFT) 50 MG tablet Take 1 tablet (50 mg total) by mouth daily.  . fluconazole (DIFLUCAN) 150 MG tablet Take 1 tablet (150 mg total) by mouth every three (3) days as needed.  . sulfamethoxazole-trimethoprim (BACTRIM) 400-80 MG tablet Take 1 tablet by mouth 2 (two) times daily.   Facility-Administered Encounter Medications as of 05/20/2019  Medication  . betamethasone acetate-betamethasone sodium phosphate (CELESTONE) injection 3 mg  . betamethasone acetate-betamethasone sodium phosphate (CELESTONE) injection 3 mg    Surgical History: Past Surgical History:  Procedure Laterality Date  . BRACHIOPLASTY Left   . BREAST BIOPSY Left 2011   neg  . FOOT SURGERY Left   . tummy tuck      Medical History: Past Medical History:  Diagnosis Date  . Anxiety   . GERD (gastroesophageal reflux disease)     Family History: Family History  Problem Relation Age of Onset  . COPD Mother   . Hypertension Father   . Hodgkin's lymphoma Brother   . Lung cancer Maternal Grandfather   . Breast cancer Paternal Grandmother     Social History   Socioeconomic History  . Marital status: Married    Spouse name: Not on file  . Number of children: Not on file  . Years of education: Not on file  . Highest education level: Not on file  Occupational History  . Not on file  Tobacco Use  . Smoking status: Former Games developer  .  Smokeless tobacco: Never Used  Substance and Sexual Activity  . Alcohol use: Yes  . Drug use: Never  . Sexual activity: Yes  Other Topics Concern  . Not on file  Social History Narrative  . Not on file   Social Determinants of Health   Financial Resource Strain:   . Difficulty of Paying Living Expenses: Not on file  Food Insecurity:   . Worried About Charity fundraiser in the Last Year: Not on file  . Ran Out of Food in the Last Year: Not on file  Transportation Needs:   . Lack of Transportation (Medical): Not on file  . Lack of Transportation (Non-Medical): Not on file   Physical Activity:   . Days of Exercise per Week: Not on file  . Minutes of Exercise per Session: Not on file  Stress:   . Feeling of Stress : Not on file  Social Connections:   . Frequency of Communication with Friends and Family: Not on file  . Frequency of Social Gatherings with Friends and Family: Not on file  . Attends Religious Services: Not on file  . Active Member of Clubs or Organizations: Not on file  . Attends Archivist Meetings: Not on file  . Marital Status: Not on file  Intimate Partner Violence:   . Fear of Current or Ex-Partner: Not on file  . Emotionally Abused: Not on file  . Physically Abused: Not on file  . Sexually Abused: Not on file      Review of Systems  Constitutional: Negative for chills, fatigue and unexpected weight change.  HENT: Negative for congestion, rhinorrhea, sneezing and sore throat.   Eyes: Negative for photophobia, pain and redness.  Respiratory: Negative for cough, chest tightness and shortness of breath.   Cardiovascular: Negative for chest pain and palpitations.  Gastrointestinal: Negative for abdominal pain, constipation, diarrhea, nausea and vomiting.  Endocrine: Negative.   Genitourinary: Negative for dysuria and frequency.  Musculoskeletal: Negative for arthralgias, back pain, joint swelling and neck pain.  Skin: Negative for rash.  Allergic/Immunologic: Negative.   Neurological: Negative for tremors and numbness.  Hematological: Negative for adenopathy. Does not bruise/bleed easily.  Psychiatric/Behavioral: Negative for behavioral problems and sleep disturbance. The patient is not nervous/anxious.     Vital Signs: Ht 5\' 2"  (1.575 m)   Wt 180 lb (81.6 kg)   BMI 32.92 kg/m    Observation/Objective:  Well appearing, NAD noted.    Assessment/Plan: 1. Acute cystitis without hematuria Advised patient to take entire course of antibiotics as prescribed with food. Pt should return to clinic in 7-10 days if  symptoms fail to improve or new symptoms develop. Follow up should be in office for new urine dip/culture if symptoms do not improve. - sulfamethoxazole-trimethoprim (BACTRIM) 400-80 MG tablet; Take 1 tablet by mouth 2 (two) times daily.  Dispense: 20 tablet; Refill: 0  2. Subacute maxillary sinusitis Take bactrim as directed.  Follow up if no improvement.  - sulfamethoxazole-trimethoprim (BACTRIM) 400-80 MG tablet; Take 1 tablet by mouth 2 (two) times daily.  Dispense: 20 tablet; Refill: 0  3. Vaginal candida Use diflucan as discussed.  - fluconazole (DIFLUCAN) 150 MG tablet; Take 1 tablet (150 mg total) by mouth every three (3) days as needed.  Dispense: 3 tablet; Refill: 0  General Counseling: Zulema verbalizes understanding of the findings of today's phone visit and agrees with plan of treatment. I have discussed any further diagnostic evaluation that may be needed or ordered today. We also  reviewed her medications today. she has been encouraged to call the office with any questions or concerns that should arise related to todays visit.    No orders of the defined types were placed in this encounter.   Meds ordered this encounter  Medications  . sulfamethoxazole-trimethoprim (BACTRIM) 400-80 MG tablet    Sig: Take 1 tablet by mouth 2 (two) times daily.    Dispense:  20 tablet    Refill:  0  . fluconazole (DIFLUCAN) 150 MG tablet    Sig: Take 1 tablet (150 mg total) by mouth every three (3) days as needed.    Dispense:  3 tablet    Refill:  0    Time spent: 20 Minutes    Blima Ledger AGNP-C Internal medicine

## 2019-05-23 ENCOUNTER — Ambulatory Visit
Admission: RE | Admit: 2019-05-23 | Discharge: 2019-05-23 | Disposition: A | Discharge status: Home / Self Care | Payer: Managed Care, Other (non HMO) | Source: Ambulatory Visit | Attending: Nurse Practitioner | Admitting: Nurse Practitioner

## 2019-05-23 DIAGNOSIS — Z1231 Encounter for screening mammogram for malignant neoplasm of breast: Secondary | ICD-10-CM | POA: Insufficient documentation

## 2019-05-24 NOTE — Progress Notes (Signed)
Negative mammogram

## 2019-07-11 ENCOUNTER — Ambulatory Visit: Payer: Managed Care, Other (non HMO)

## 2019-08-09 ENCOUNTER — Telehealth: Payer: Self-pay

## 2019-08-09 NOTE — Telephone Encounter (Signed)
Lmom to confirm and screen for 08-13-19 ov. 

## 2019-08-13 ENCOUNTER — Encounter: Payer: Self-pay | Admitting: Nurse Practitioner

## 2019-08-13 ENCOUNTER — Other Ambulatory Visit: Payer: Self-pay

## 2019-08-13 ENCOUNTER — Ambulatory Visit: Payer: Managed Care, Other (non HMO) | Admitting: Nurse Practitioner

## 2019-08-13 VITALS — BP 126/59 | HR 49 | Temp 97.3°F | Resp 16 | Ht 62.0 in | Wt 175.0 lb

## 2019-08-13 DIAGNOSIS — R300 Dysuria: Secondary | ICD-10-CM

## 2019-08-13 DIAGNOSIS — F3342 Major depressive disorder, recurrent, in full remission: Secondary | ICD-10-CM

## 2019-08-13 DIAGNOSIS — K219 Gastro-esophageal reflux disease without esophagitis: Secondary | ICD-10-CM | POA: Diagnosis not present

## 2019-08-13 DIAGNOSIS — J301 Allergic rhinitis due to pollen: Secondary | ICD-10-CM | POA: Diagnosis not present

## 2019-08-13 LAB — POCT URINALYSIS DIPSTICK
Bilirubin, UA: NEGATIVE
Blood, UA: NEGATIVE
Glucose, UA: NEGATIVE
Ketones, UA: NEGATIVE
Leukocytes, UA: NEGATIVE
Nitrite, UA: NEGATIVE
Protein, UA: NEGATIVE
Spec Grav, UA: 1.01 (ref 1.010–1.025)
Urobilinogen, UA: 0.2 E.U./dL
pH, UA: 5 (ref 5.0–8.0)

## 2019-08-13 MED ORDER — FLUTICASONE PROPIONATE 50 MCG/ACT NA SUSP: 2.0000 | Freq: Every day | NASAL | 5 refills | Status: DC

## 2019-08-13 NOTE — Progress Notes (Signed)
[facility] [address] [place], [place] [address]  Internal MEDICINE  Office Visit Note  Patient Name: Jennifer Branch  [id number]  [id number]  Date of Service: 08/28/2019  Chief Complaint  Patient presents with  . Urinary Tract Infection  . Gastroesophageal Reflux  . Anxiety  . Sinusitis    sinus drainage     The patient is here for routine follow up visit. She states that she has been having some intermittent urinary frequency. She denies dysuria or flank pain. She states that she does have some GERD. Sometimes she needs to take pepcid which does not seem to be helping so much. This is made worse by certain foods she eats and by stress. She states that she is doing well, otherwise. Continues to take low dose sertraline which continues to control her generalized anxiety/depression.       Current Medication: Outpatient Encounter Medications as of 08/13/2019  Medication Sig  . Black Cohosh (BLACK COHOSH HOT FLASH RELIEF) 40 MG CAPS Take by mouth.  . DYMISTA 137-50 MCG/ACT SUSP 1 spray.  . fluconazole (DIFLUCAN) 150 MG tablet Take 1 tablet (150 mg total) by mouth every three (3) days as needed.  Marland Kitchen levocetirizine (XYZAL) 5 MG tablet 5 mg.  . meloxicam (MOBIC) 7.5 MG tablet Take 1 tablet (7.5 mg total) by mouth daily.  . Probiotic Product (PROBIOTIC DAILY PO) Take by mouth.  . sertraline (ZOLOFT) 50 MG tablet Take 1 tablet (50 mg total) by mouth daily.  . [DISCONTINUED] montelukast (SINGULAIR) 10 MG tablet Take 1 tablet (10 mg total) by mouth at bedtime.  . [DISCONTINUED] omeprazole (PRILOSEC) 10 MG capsule Take 1 capsule (10 mg total) by mouth daily.  . fluticasone (FLONASE) 50 MCG/ACT nasal spray Place 2 sprays into both nostrils daily.  . nitrofurantoin, macrocrystal-monohydrate, (MACROBID) 100 MG capsule Take 1 capsule (100 mg total) by mouth 2 (two) times daily. (Patient not taking: Reported on 08/13/2019)  . sulfamethoxazole-trimethoprim (BACTRIM) 400-80 MG tablet  Take 1 tablet by mouth 2 (two) times daily. (Patient not taking: Reported on 08/13/2019)   Facility-Administered Encounter Medications as of 08/13/2019  Medication  . betamethasone acetate-betamethasone sodium phosphate (CELESTONE) injection 3 mg  . betamethasone acetate-betamethasone sodium phosphate (CELESTONE) injection 3 mg    Surgical History: Past Surgical History:  Procedure Laterality Date  . BRACHIOPLASTY Left   . BREAST BIOPSY Left 2011   neg  . FOOT SURGERY Left   . tummy tuck      Medical History: Past Medical History:  Diagnosis Date  . Anxiety   . GERD (gastroesophageal reflux disease)     Family History: Family History  Problem Relation Age of Onset  . COPD Mother   . Hypertension Father   . Hodgkin's lymphoma Brother   . Lung cancer Maternal Grandfather   . Breast cancer Paternal Grandmother     Social History   Socioeconomic History  . Marital status: Married    Spouse name: Not on file  . Number of children: Not on file  . Years of education: Not on file  . Highest education level: Not on file  Occupational History  . Not on file  Tobacco Use  . Smoking status: Former Games developer  . Smokeless tobacco: Never Used  Substance and Sexual Activity  . Alcohol use: Yes  . Drug use: Never  . Sexual activity: Yes  Other Topics Concern  . Not on file  Social History Narrative  . Not on file   Social Determinants  of Health   Financial Resource Strain:   . Difficulty of Paying Living Expenses:   Food Insecurity:   . Worried About Charity fundraiser in the Last Year:   . Arboriculturist in the Last Year:   Transportation Needs:   . Film/video editor (Medical):   Marland Kitchen Lack of Transportation (Non-Medical):   Physical Activity:   . Days of Exercise per Week:   . Minutes of Exercise per Session:   Stress:   . Feeling of Stress :   Social Connections:   . Frequency of Communication with Friends and Family:   . Frequency of Social Gatherings with  Friends and Family:   . Attends Religious Services:   . Active Member of Clubs or Organizations:   . Attends Archivist Meetings:   Marland Kitchen Marital Status:   Intimate Partner Violence:   . Fear of Current or Ex-Partner:   . Emotionally Abused:   Marland Kitchen Physically Abused:   . Sexually Abused:       Review of Systems  Constitutional: Negative for activity change, chills, fatigue and unexpected weight change.  HENT: Negative for congestion, postnasal drip, rhinorrhea, sneezing and sore throat.   Respiratory: Negative for cough, chest tightness, shortness of breath and wheezing.   Cardiovascular: Negative for chest pain and palpitations.  Gastrointestinal: Negative for abdominal pain, constipation, diarrhea, nausea and vomiting.       The patient has increased episodes of GERD.   Endocrine: Negative for cold intolerance, heat intolerance, polydipsia and polyuria.  Genitourinary: Positive for frequency. Negative for dysuria and urgency.  Musculoskeletal: Negative for arthralgias, back pain, joint swelling and neck pain.  Skin: Negative for rash.  Allergic/Immunologic: Negative for environmental allergies.  Neurological: Negative for dizziness, tremors, numbness and headaches.  Hematological: Negative for adenopathy. Does not bruise/bleed easily.  Psychiatric/Behavioral: Positive for dysphoric mood. Negative for behavioral problems (Depression), sleep disturbance and suicidal ideas. The patient is not nervous/anxious.        Well managed on current dose of sertraline.     Today's Vitals   08/13/19 1531  BP: (!) 126/59  Pulse: (!) 49  Resp: 16  Temp: (!) 97.3 F (36.3 C)  SpO2: 99%  Weight: 175 lb (79.4 kg)  Height: 5\' 2"  (1.575 m)   Body mass index is 32.01 kg/m.  Physical Exam Vitals and nursing note reviewed.  Constitutional:      General: She is not in acute distress.    Appearance: Normal appearance. She is well-developed. She is not diaphoretic.  HENT:     Head:  Normocephalic and atraumatic.     Nose: Nose normal.     Mouth/Throat:     Pharynx: No oropharyngeal exudate.  Eyes:     Conjunctiva/sclera: Conjunctivae normal.     Pupils: Pupils are equal, round, and reactive to light.  Neck:     Thyroid: No thyromegaly.     Vascular: No JVD.     Trachea: No tracheal deviation.  Cardiovascular:     Rate and Rhythm: Normal rate and regular rhythm.     Heart sounds: Normal heart sounds. No murmur. No friction rub. No gallop.   Pulmonary:     Effort: Pulmonary effort is normal. No respiratory distress.     Breath sounds: Normal breath sounds. No wheezing or rales.  Chest:     Chest wall: No tenderness.  Abdominal:     Palpations: Abdomen is soft.  Musculoskeletal:  General: Normal range of motion.     Cervical back: Normal range of motion and neck supple.  Lymphadenopathy:     Cervical: No cervical adenopathy.  Skin:    General: Skin is warm and dry.  Neurological:     Mental Status: She is alert and oriented to person, place, and time.     Cranial Nerves: No cranial nerve deficit.  Psychiatric:        Mood and Affect: Mood normal.        Behavior: Behavior normal.        Thought Content: Thought content normal.        Judgment: Judgment normal.    Assessment/Plan: 1. Dysuria - POCT Urinalysis Dipstick negative for evidence of infection or other abnormalities   2. Allergic rhinitis due to pollen, unspecified seasonality Add flonase nasal spray daily. Continue oral antihistamines as before.  - fluticasone (FLONASE) 50 MCG/ACT nasal spray; Place 2 sprays into both nostrils daily.  Dispense: 16 g; Refill: 5  3. Gastroesophageal reflux disease without esophagitis Omeprazole should be taken as needed.   4. Recurrent major depressive disorder, in full remission (HCC) Stable. Continue sertraline as prescribed   General Counseling: sanjana folz understanding of the findings of todays visit and agrees with plan of treatment. I  have discussed any further diagnostic evaluation that may be needed or ordered today. We also reviewed her medications today. she has been encouraged to call the office with any questions or concerns that should arise related to todays visit.  This patient was seen by Vincent Gros FNP Collaboration with Dr Lyndon Code as a part of collaborative care agreement  Orders Placed This Encounter  Procedures  . POCT Urinalysis Dipstick    Meds ordered this encounter  Medications  . fluticasone (FLONASE) 50 MCG/ACT nasal spray    Sig: Place 2 sprays into both nostrils daily.    Dispense:  16 g    Refill:  5    Order Specific Question:   Supervising Provider    Answer:   Lyndon Code [[id number]]    Total time spent: 30 Minutes   Time spent includes review of chart, medications, test results, and follow up plan with the patient.      Dr Lyndon Code Internal medicine

## 2019-08-28 ENCOUNTER — Other Ambulatory Visit: Payer: Self-pay

## 2019-08-28 DIAGNOSIS — K219 Gastro-esophageal reflux disease without esophagitis: Secondary | ICD-10-CM

## 2019-08-28 DIAGNOSIS — J301 Allergic rhinitis due to pollen: Secondary | ICD-10-CM

## 2019-08-28 MED ORDER — OMEPRAZOLE 10 MG PO CPDR: 10.0000 mg | DELAYED_RELEASE_CAPSULE | Freq: Every day | ORAL | 1 refills | Status: DC

## 2019-08-28 MED ORDER — MONTELUKAST SODIUM 10 MG PO TABS: 10.0000 mg | ORAL_TABLET | Freq: Every day | ORAL | 1 refills | Status: DC

## 2020-01-22 ENCOUNTER — Encounter: Payer: Self-pay | Admitting: Hospice and Palliative Medicine

## 2020-01-22 ENCOUNTER — Ambulatory Visit: Payer: Managed Care, Other (non HMO) | Admitting: Hospice and Palliative Medicine

## 2020-01-22 DIAGNOSIS — R350 Frequency of micturition: Secondary | ICD-10-CM

## 2020-01-22 DIAGNOSIS — K219 Gastro-esophageal reflux disease without esophagitis: Secondary | ICD-10-CM | POA: Diagnosis not present

## 2020-01-22 DIAGNOSIS — R300 Dysuria: Secondary | ICD-10-CM | POA: Diagnosis not present

## 2020-01-22 LAB — POCT URINALYSIS DIPSTICK
Bilirubin, UA: NEGATIVE
Blood, UA: NEGATIVE
Glucose, UA: NEGATIVE
Ketones, UA: NEGATIVE
Nitrite, UA: NEGATIVE
Protein, UA: NEGATIVE
Spec Grav, UA: <=1.005 — AB (ref 1.010–1.025)
Urobilinogen, UA: 0.2 E.U./dL
pH, UA: 6 (ref 5.0–8.0)

## 2020-01-22 NOTE — Progress Notes (Signed)
[facility] [address] [place], [place] [address]  Internal MEDICINE  Office Visit Note  Patient Name: Jennifer Branch  [id number]  [id number]  Date of Service: 01/25/2020  Chief Complaint  Patient presents with  . Urinary Tract Infection  . Acute Visit    feels off  . Quality Metric Gaps    HepC, TDAP    HPI Patient is here for acute visit She feels that she might have a UTI Her only symptoms is that she has increased frequency but has also increased her intake of water She is very cautious about having a UTI--in the past has had complications and recurrent infections   Current Medication: Outpatient Encounter Medications as of 01/22/2020  Medication Sig  . Black Cohosh (BLACK COHOSH HOT FLASH RELIEF) 40 MG CAPS Take by mouth.  . montelukast (SINGULAIR) 10 MG tablet Take 1 tablet (10 mg total) by mouth at bedtime.  Marland Kitchen omeprazole (PRILOSEC) 10 MG capsule Take 1 capsule (10 mg total) by mouth daily.  . Probiotic Product (PROBIOTIC DAILY PO) Take by mouth.  . [DISCONTINUED] DYMISTA 137-50 MCG/ACT SUSP 1 spray. (Patient not taking: Reported on 01/22/2020)  . [DISCONTINUED] fluconazole (DIFLUCAN) 150 MG tablet Take 1 tablet (150 mg total) by mouth every three (3) days as needed. (Patient not taking: Reported on 01/22/2020)  . [DISCONTINUED] fluticasone (FLONASE) 50 MCG/ACT nasal spray Place 2 sprays into both nostrils daily. (Patient not taking: Reported on 01/22/2020)  . [DISCONTINUED] levocetirizine (XYZAL) 5 MG tablet 5 mg. (Patient not taking: Reported on 01/22/2020)  . [DISCONTINUED] meloxicam (MOBIC) 7.5 MG tablet Take 1 tablet (7.5 mg total) by mouth daily. (Patient not taking: Reported on 01/22/2020)  . [DISCONTINUED] nitrofurantoin, macrocrystal-monohydrate, (MACROBID) 100 MG capsule Take 1 capsule (100 mg total) by mouth 2 (two) times daily. (Patient not taking: Reported on 01/22/2020)  . [DISCONTINUED] sertraline (ZOLOFT) 50 MG tablet Take 1 tablet (50 mg total) by  mouth daily. (Patient not taking: Reported on 01/22/2020)  . [DISCONTINUED] sulfamethoxazole-trimethoprim (BACTRIM) 400-80 MG tablet Take 1 tablet by mouth 2 (two) times daily. (Patient not taking: Reported on 01/22/2020)   Facility-Administered Encounter Medications as of 01/22/2020  Medication  . betamethasone acetate-betamethasone sodium phosphate (CELESTONE) injection 3 mg  . betamethasone acetate-betamethasone sodium phosphate (CELESTONE) injection 3 mg    Surgical History: Past Surgical History:  Procedure Laterality Date  . BRACHIOPLASTY Left   . BREAST BIOPSY Left 2011   neg  . FOOT SURGERY Left   . tummy tuck      Medical History: Past Medical History:  Diagnosis Date  . Anxiety   . GERD (gastroesophageal reflux disease)     Family History: Family History  Problem Relation Age of Onset  . COPD Mother   . Hypertension Father   . Hodgkin's lymphoma Brother   . Lung cancer Maternal Grandfather   . Breast cancer Paternal Grandmother     Social History   Socioeconomic History  . Marital status: Married    Spouse name: Not on file  . Number of children: Not on file  . Years of education: Not on file  . Highest education level: Not on file  Occupational History  . Not on file  Tobacco Use  . Smoking status: Former Games developer  . Smokeless tobacco: Never Used  Vaping Use  . Vaping Use: Never used  Substance and Sexual Activity  . Alcohol use: Yes  . Drug use: Never  . Sexual activity: Yes  Other Topics Concern  .  Not on file  Social History Narrative  . Not on file   Social Determinants of Health   Financial Resource Strain:   . Difficulty of Paying Living Expenses: Not on file  Food Insecurity:   . Worried About Programme researcher, broadcasting/film/video in the Last Year: Not on file  . Ran Out of Food in the Last Year: Not on file  Transportation Needs:   . Lack of Transportation (Medical): Not on file  . Lack of Transportation (Non-Medical): Not on file  Physical Activity:    . Days of Exercise per Week: Not on file  . Minutes of Exercise per Session: Not on file  Stress:   . Feeling of Stress : Not on file  Social Connections:   . Frequency of Communication with Friends and Family: Not on file  . Frequency of Social Gatherings with Friends and Family: Not on file  . Attends Religious Services: Not on file  . Active Member of Clubs or Organizations: Not on file  . Attends Banker Meetings: Not on file  . Marital Status: Not on file  Intimate Partner Violence:   . Fear of Current or Ex-Partner: Not on file  . Emotionally Abused: Not on file  . Physically Abused: Not on file  . Sexually Abused: Not on file      Review of Systems  Constitutional: Negative for chills, diaphoresis and fatigue.  HENT: Negative for ear pain, postnasal drip and sinus pressure.   Eyes: Negative for photophobia, discharge, redness, itching and visual disturbance.  Respiratory: Negative for cough, shortness of breath and wheezing.   Cardiovascular: Negative for chest pain, palpitations and leg swelling.  Gastrointestinal: Negative for abdominal pain, constipation, diarrhea, nausea and vomiting.  Genitourinary: Positive for frequency. Negative for dysuria and flank pain.  Musculoskeletal: Negative for arthralgias, back pain, gait problem and neck pain.  Skin: Negative for color change.  Allergic/Immunologic: Negative for environmental allergies and food allergies.  Neurological: Negative for dizziness and headaches.  Hematological: Does not bruise/bleed easily.  Psychiatric/Behavioral: Negative for agitation, behavioral problems (depression) and hallucinations.    Vital Signs: BP 114/70   Pulse 65   Temp (!) 97.5 F (36.4 C)   Resp 16   Ht 5\' 2"  (1.575 m)   Wt 183 lb 6.4 oz (83.2 kg)   SpO2 98%   BMI 33.54 kg/m    Physical Exam Vitals reviewed.  Constitutional:      Appearance: Normal appearance. She is obese.  Cardiovascular:     Rate and Rhythm:  Normal rate and regular rhythm.     Pulses: Normal pulses.     Heart sounds: Normal heart sounds.  Pulmonary:     Effort: Pulmonary effort is normal.     Breath sounds: Normal breath sounds.  Skin:    General: Skin is warm.  Neurological:     General: No focal deficit present.     Mental Status: She is alert and oriented to person, place, and time. Mental status is at baseline.  Psychiatric:        Mood and Affect: Mood normal.        Behavior: Behavior normal.        Thought Content: Thought content normal.     Assessment/Plan: 1. Increased urinary frequency Will await urine cultures, she feels this may just be her being too cautious, advised we will treat based on culture results  2. Gastroesophageal reflux disease without esophagitis Stable with omeprazole, continue to monitor  3.  Dysuria Dipstick normal, await culture results - POCT Urinalysis Dipstick - CULTURE, URINE COMPREHENSIVE  General Counseling: Yicel verbalizes understanding of the findings of todays visit and agrees with plan of treatment. I have discussed any further diagnostic evaluation that may be needed or ordered today. We also reviewed her medications today. she has been encouraged to call the office with any questions or concerns that should arise related to todays visit.    Orders Placed This Encounter  Procedures  . CULTURE, URINE COMPREHENSIVE  . POCT Urinalysis Dipstick      Time spent:25 Minutes Time spent includes review of chart, medications, test results and follow-up plan with the patient.  This patient was seen by Leeanne Deed AGNP-C in Collaboration with Dr Lyndon Code as a part of collaborative care agreement     Lubertha Basque. Phoua Hoadley AGNP-C Internal medicine

## 2020-01-25 ENCOUNTER — Encounter: Payer: Self-pay | Admitting: Hospice and Palliative Medicine

## 2020-01-25 LAB — CULTURE, URINE COMPREHENSIVE

## 2020-01-31 ENCOUNTER — Ambulatory Visit: Payer: Managed Care, Other (non HMO) | Admitting: Nurse Practitioner

## 2020-01-31 ENCOUNTER — Other Ambulatory Visit: Payer: Self-pay

## 2020-01-31 ENCOUNTER — Encounter: Payer: Self-pay | Admitting: Nurse Practitioner

## 2020-01-31 VITALS — BP 124/74 | HR 60 | Temp 97.3°F | Resp 16 | Ht 62.0 in | Wt 178.2 lb

## 2020-01-31 DIAGNOSIS — F330 Major depressive disorder, recurrent, mild: Secondary | ICD-10-CM | POA: Diagnosis not present

## 2020-01-31 DIAGNOSIS — Z23 Encounter for immunization: Secondary | ICD-10-CM | POA: Diagnosis not present

## 2020-01-31 DIAGNOSIS — F3342 Major depressive disorder, recurrent, in full remission: Secondary | ICD-10-CM

## 2020-01-31 DIAGNOSIS — Z6832 Body mass index (BMI) 32.0-32.9, adult: Secondary | ICD-10-CM

## 2020-01-31 MED ORDER — BUPROPION HCL 75 MG PO TABS: 75.0000 mg | ORAL_TABLET | Freq: Two times a day (BID) | ORAL | 2 refills | Status: DC

## 2020-01-31 NOTE — Progress Notes (Signed)
[facility] [address] [place], [place] [address]  Internal MEDICINE  Office Visit Note  Patient Name: Jennifer Branch  [id number]  [id number]  Date of Service: 02/19/2020  Chief Complaint  Patient presents with  . Follow-up    form needs filled out, left knee inflamed, right wrist inflamed  . Anxiety  . policy update form    received    The patient is here for routine follow up. She has brought biometric exemption form with her to ve filled out and returned to her employer so that she can avoid and increased premium with her health insurance due to BMI. The patient's current BMI is 32.59. in 02/19/2013, her BMI 54.89. she has lost the weight with diet and exercise over the past seven years  She continues to have a low calorie diet and exercises frequently.  She does feel a little depressed recently. She is unfocused and having trouble staying on track at work. She has been on sertraline in the past and would like to try something different to help with depression and anxiety.       Current Medication: Outpatient Encounter Medications as of 01/31/2020  Medication Sig  . Black Cohosh (BLACK COHOSH HOT FLASH RELIEF) 40 MG CAPS Take by mouth.  . Probiotic Product (PROBIOTIC DAILY PO) Take by mouth.  . [DISCONTINUED] montelukast (SINGULAIR) 10 MG tablet Take 1 tablet (10 mg total) by mouth at bedtime.  . [DISCONTINUED] omeprazole (PRILOSEC) 10 MG capsule Take 1 capsule (10 mg total) by mouth daily.  Marland Kitchen buPROPion (WELLBUTRIN) 75 MG tablet Take 1 tablet (75 mg total) by mouth 2 (two) times daily.   Facility-Administered Encounter Medications as of 01/31/2020  Medication  . betamethasone acetate-betamethasone sodium phosphate (CELESTONE) injection 3 mg  . betamethasone acetate-betamethasone sodium phosphate (CELESTONE) injection 3 mg    Surgical History: Past Surgical History:  Procedure Laterality Date  . BRACHIOPLASTY Left   . BREAST BIOPSY Left 2011   neg  .  FOOT SURGERY Left   . tummy tuck      Medical History: Past Medical History:  Diagnosis Date  . Anxiety   . GERD (gastroesophageal reflux disease)     Family History: Family History  Problem Relation Age of Onset  . COPD Mother   . Hypertension Father   . Hodgkin's lymphoma Brother   . Lung cancer Maternal Grandfather   . Breast cancer Paternal Grandmother     Social History   Socioeconomic History  . Marital status: Married    Spouse name: Not on file  . Number of children: Not on file  . Years of education: Not on file  . Highest education level: Not on file  Occupational History  . Not on file  Tobacco Use  . Smoking status: Former Games developer  . Smokeless tobacco: Never Used  Vaping Use  . Vaping Use: Never used  Substance and Sexual Activity  . Alcohol use: Yes    Comment: occ.  . Drug use: Never  . Sexual activity: Yes  Other Topics Concern  . Not on file  Social History Narrative  . Not on file   Social Determinants of Health   Financial Resource Strain:   . Difficulty of Paying Living Expenses: Not on file  Food Insecurity:   . Worried About Programme researcher, broadcasting/film/video in the Last Year: Not on file  . Ran Out of Food in the Last Year: Not on file  Transportation Needs:   . Lack of  Transportation (Medical): Not on file  . Lack of Transportation (Non-Medical): Not on file  Physical Activity:   . Days of Exercise per Week: Not on file  . Minutes of Exercise per Session: Not on file  Stress:   . Feeling of Stress : Not on file  Social Connections:   . Frequency of Communication with Friends and Family: Not on file  . Frequency of Social Gatherings with Friends and Family: Not on file  . Attends Religious Services: Not on file  . Active Member of Clubs or Organizations: Not on file  . Attends Banker Meetings: Not on file  . Marital Status: Not on file  Intimate Partner Violence:   . Fear of Current or Ex-Partner: Not on file  . Emotionally  Abused: Not on file  . Physically Abused: Not on file  . Sexually Abused: Not on file      Review of Systems  Constitutional: Negative for chills, fatigue and unexpected weight change.       Five pound weight loss since her last visit   HENT: Negative for congestion, postnasal drip, rhinorrhea, sneezing and sore throat.   Respiratory: Negative for cough, chest tightness, shortness of breath and wheezing.   Cardiovascular: Negative for chest pain and palpitations.  Gastrointestinal: Negative for abdominal pain, constipation, diarrhea, nausea and vomiting.  Endocrine: Negative for cold intolerance, heat intolerance, polydipsia and polyuria.  Musculoskeletal: Negative for arthralgias, back pain, joint swelling and neck pain.  Skin: Negative for rash.  Allergic/Immunologic: Negative for environmental allergies.  Neurological: Negative for dizziness, tremors, numbness and headaches.  Hematological: Negative for adenopathy. Does not bruise/bleed easily.  Psychiatric/Behavioral: Positive for decreased concentration. Negative for behavioral problems (Depression), sleep disturbance and suicidal ideas. The patient is nervous/anxious.     Today's Vitals   01/31/20 1047  BP: 124/74  Pulse: 60  Resp: 16  Temp: (!) 97.3 F (36.3 C)  SpO2: 98%  Weight: 178 lb 3.2 oz (80.8 kg)  Height: 5\' 2"  (1.575 m)   Body mass index is 32.59 kg/m.  Physical Exam Vitals and nursing note reviewed.  Constitutional:      General: She is not in acute distress.    Appearance: Normal appearance. She is well-developed. She is not diaphoretic.  HENT:     Head: Normocephalic and atraumatic.     Mouth/Throat:     Pharynx: No oropharyngeal exudate.  Eyes:     Pupils: Pupils are equal, round, and reactive to light.  Neck:     Thyroid: No thyromegaly.     Vascular: No carotid bruit or JVD.     Trachea: No tracheal deviation.  Cardiovascular:     Rate and Rhythm: Normal rate and regular rhythm.     Heart  sounds: Normal heart sounds. No murmur heard.  No friction rub. No gallop.   Pulmonary:     Effort: Pulmonary effort is normal. No respiratory distress.     Breath sounds: Normal breath sounds. No wheezing or rales.  Chest:     Chest wall: No tenderness.  Abdominal:     Palpations: Abdomen is soft.  Musculoskeletal:        General: Normal range of motion.     Cervical back: Normal range of motion and neck supple.  Lymphadenopathy:     Cervical: No cervical adenopathy.  Skin:    General: Skin is warm and dry.  Neurological:     Mental Status: She is alert and oriented to person, place, and time.  Cranial Nerves: No cranial nerve deficit.  Psychiatric:        Attention and Perception: Attention and perception normal.        Mood and Affect: Affect normal. Mood is anxious.        Speech: Speech normal.        Behavior: Behavior normal. Behavior is cooperative.        Thought Content: Thought content normal.        Cognition and Memory: Cognition and memory normal.        Judgment: Judgment normal.    Assessment/Plan: 1. BMI 32.0-32.9,adult Biometric exemption form filled out and returned to the patient during visit. She plans to continue with 1200-1500 caloire diet and her regular exercise routine. She will follow up routinely  2. Mild episode of recurrent major depressive disorder (HCC) No longer taking sertraline 25mg . Will do trial of wellbutrin 75mg . Advised herto start this daily. May add second dose in afternoon as needed and as tolerated.  - buPROPion (WELLBUTRIN) 75 MG tablet; Take 1 tablet (75 mg total) by mouth 2 (two) times daily.  Dispense: 60 tablet; Refill: 2  3. Flu vaccine need Flu vaccine administered today.  - Flu Vaccine MDCK QUAD PF  General Counseling: Halia verbalizes understanding of the findings of todays visit and agrees with plan of treatment. I have discussed any further diagnostic evaluation that may be needed or ordered today. We also reviewed her  medications today. she has been encouraged to call the office with any questions or concerns that should arise related to todays visit.  Obesity Counseling: Risk Assessment: An assessment of behavioral risk factors was made today and includes lack of exercise sedentary lifestyle, lack of portion control and poor dietary habits.  Risk Modification Advice: She was counseled on portion control guidelines. Restricting daily caloric intake to 1200-1500 calories per day. The detrimental long term effects of obesity on her health and ongoing poor compliance was also discussed with the patient.  This patient was seen by FNP Collaboration with Dr French Ana as a part of collaborative care agreement  Orders Placed This Encounter  Procedures  . Flu Vaccine MDCK QUAD PF    Meds ordered this encounter  Medications  . buPROPion (WELLBUTRIN) 75 MG tablet    Sig: Take 1 tablet (75 mg total) by mouth 2 (two) times daily.    Dispense:  60 tablet    Refill:  2    Written prescription administered today    Order Specific Question:   Supervising Provider    Answer:   Vincent Gros [[id number]]    Total time spent: 30 Minutes   Time spent includes review of chart, medications, test results, and follow up plan with the patient.      Dr Lyndon Code Internal medicine

## 2020-02-11 ENCOUNTER — Ambulatory Visit: Payer: Managed Care, Other (non HMO) | Admitting: Nurse Practitioner

## 2020-02-14 ENCOUNTER — Other Ambulatory Visit: Payer: Self-pay

## 2020-02-14 DIAGNOSIS — K219 Gastro-esophageal reflux disease without esophagitis: Secondary | ICD-10-CM

## 2020-02-14 DIAGNOSIS — J301 Allergic rhinitis due to pollen: Secondary | ICD-10-CM

## 2020-02-14 MED ORDER — OMEPRAZOLE 10 MG PO CPDR: 10.0000 mg | DELAYED_RELEASE_CAPSULE | Freq: Every day | ORAL | 1 refills | Status: DC

## 2020-02-14 MED ORDER — MONTELUKAST SODIUM 10 MG PO TABS: 10.0000 mg | ORAL_TABLET | Freq: Every day | ORAL | 1 refills | Status: DC

## 2020-02-19 DIAGNOSIS — Z6832 Body mass index (BMI) 32.0-32.9, adult: Secondary | ICD-10-CM | POA: Insufficient documentation

## 2020-02-19 DIAGNOSIS — F330 Major depressive disorder, recurrent, mild: Secondary | ICD-10-CM | POA: Insufficient documentation

## 2020-04-02 ENCOUNTER — Other Ambulatory Visit: Payer: Self-pay

## 2020-04-02 ENCOUNTER — Encounter: Payer: Self-pay | Admitting: Nurse Practitioner

## 2020-04-02 ENCOUNTER — Ambulatory Visit (INDEPENDENT_AMBULATORY_CARE_PROVIDER_SITE_OTHER): Payer: Managed Care, Other (non HMO) | Admitting: Nurse Practitioner

## 2020-04-02 VITALS — BP 98/58 | HR 69 | Temp 97.3°F | Resp 16 | Ht 62.0 in | Wt 174.2 lb

## 2020-04-02 DIAGNOSIS — N959 Unspecified menopausal and perimenopausal disorder: Secondary | ICD-10-CM | POA: Diagnosis not present

## 2020-04-02 DIAGNOSIS — Z0001 Encounter for general adult medical examination with abnormal findings: Secondary | ICD-10-CM | POA: Diagnosis not present

## 2020-04-02 DIAGNOSIS — R300 Dysuria: Secondary | ICD-10-CM

## 2020-04-02 DIAGNOSIS — F330 Major depressive disorder, recurrent, mild: Secondary | ICD-10-CM

## 2020-04-02 DIAGNOSIS — R5383 Other fatigue: Secondary | ICD-10-CM | POA: Diagnosis not present

## 2020-04-02 MED ORDER — BUPROPION HCL 75 MG PO TABS: 75.0000 mg | ORAL_TABLET | Freq: Two times a day (BID) | ORAL | 1 refills | Status: DC

## 2020-04-02 NOTE — Progress Notes (Signed)
[facility] [address] [place], [place] [address]  Internal MEDICINE  Office Visit Note  Patient Name: Jennifer Branch  [id number]  [id number]  Date of Service: 04/02/2020  Chief Complaint  Patient presents with  . Annual Exam    Menopause, discuss meds     The patient presents for health maintenance exam.  -started on wellbutrin 75mg  at her most recent visit. She is taking this only once daily. States that, even before she went out of work for surgery, she had noted a big improvement in mood and motivation to get work done  -continues to feel very fatigued, sometimes even lethargic.  -sees GYN provider for well woman exams and pap smears. Most recent pap smear was 01/23/2018 and was normal.  -most recent mammogram was done 05/24/2019 and was negative.  -due for routine, fasting labs.   Pt is here for routine health maintenance examination  Current Medication: Outpatient Encounter Medications as of 04/02/2020  Medication Sig  . Black Cohosh 40 MG CAPS Take by mouth.  . montelukast (SINGULAIR) 10 MG tablet Take 1 tablet (10 mg total) by mouth at bedtime.  04/04/2020 omeprazole (PRILOSEC) 10 MG capsule Take 1 capsule (10 mg total) by mouth daily.  . Probiotic Product (PROBIOTIC DAILY PO) Take by mouth.  . [DISCONTINUED] buPROPion (WELLBUTRIN) 75 MG tablet Take 1 tablet (75 mg total) by mouth 2 (two) times daily.  Marland Kitchen buPROPion (WELLBUTRIN) 75 MG tablet Take 1 tablet (75 mg total) by mouth 2 (two) times daily.   Facility-Administered Encounter Medications as of 04/02/2020  Medication  . betamethasone acetate-betamethasone sodium phosphate (CELESTONE) injection 3 mg  . betamethasone acetate-betamethasone sodium phosphate (CELESTONE) injection 3 mg    Surgical History: Past Surgical History:  Procedure Laterality Date  . BRACHIOPLASTY Left   . BREAST BIOPSY Left 2011   neg  . FOOT SURGERY Left   . INNER THIGH LIFT    . tummy tuck      Medical History: Past Medical  History:  Diagnosis Date  . Anxiety   . GERD (gastroesophageal reflux disease)     Family History: Family History  Problem Relation Age of Onset  . COPD Mother   . Hypertension Father   . Hodgkin's lymphoma Brother   . Lung cancer Maternal Grandfather   . Breast cancer Paternal Grandmother       Review of Systems  Constitutional: Positive for fatigue. Negative for activity change, chills and unexpected weight change.  HENT: Negative for congestion, postnasal drip, rhinorrhea, sneezing and sore throat.   Respiratory: Negative for cough, chest tightness, shortness of breath and wheezing.   Cardiovascular: Negative for chest pain and palpitations.  Gastrointestinal: Negative for abdominal pain, constipation, diarrhea, nausea and vomiting.  Endocrine: Negative for cold intolerance, heat intolerance, polydipsia and polyuria.  Genitourinary: Negative for dysuria and frequency.  Musculoskeletal: Negative for arthralgias, back pain, joint swelling and neck pain.  Skin: Negative for rash.  Allergic/Immunologic: Negative for environmental allergies.  Neurological: Negative for dizziness, tremors, numbness and headaches.  Hematological: Negative for adenopathy. Does not bruise/bleed easily.  Psychiatric/Behavioral: Positive for decreased concentration. Negative for behavioral problems (Depression), sleep disturbance and suicidal ideas. The patient is nervous/anxious.        Improved with use of wellbutrin 75mg  daily. Has not increased the dose to twice daily.     Today's Vitals   04/02/20 1146  BP: (!) 98/58  Pulse: 69  Resp: 16  Temp: (!) 97.3 F (36.3 C)  SpO2: 99%  Weight: 174 lb 3.2 oz (79 kg)  Height: 5\' 2"  (1.575 m)   Body mass index is 31.86 kg/m.  Physical Exam Vitals and nursing note reviewed.  Constitutional:      General: She is not in acute distress.    Appearance: Normal appearance. She is well-developed and well-nourished. She is not diaphoretic.  HENT:      Head: Normocephalic and atraumatic.     Nose: Nose normal.     Mouth/Throat:     Mouth: Oropharynx is clear and moist.     Pharynx: No oropharyngeal exudate.  Eyes:     Extraocular Movements: EOM normal.     Pupils: Pupils are equal, round, and reactive to light.  Neck:     Thyroid: No thyromegaly.     Vascular: No carotid bruit or JVD.     Trachea: No tracheal deviation.  Cardiovascular:     Rate and Rhythm: Normal rate and regular rhythm.     Pulses: Normal pulses.     Heart sounds: Normal heart sounds. No murmur heard. No friction rub. No gallop.   Pulmonary:     Effort: Pulmonary effort is normal. No respiratory distress.     Breath sounds: Normal breath sounds. No wheezing or rales.  Chest:     Chest wall: No tenderness.  Abdominal:     General: Bowel sounds are normal.     Palpations: Abdomen is soft.     Tenderness: There is no abdominal tenderness.  Musculoskeletal:        General: Normal range of motion.     Cervical back: Normal range of motion and neck supple.  Lymphadenopathy:     Cervical: No cervical adenopathy.  Skin:    General: Skin is warm and dry.  Neurological:     General: No focal deficit present.     Mental Status: She is alert and oriented to person, place, and time.     Cranial Nerves: No cranial nerve deficit.  Psychiatric:        Mood and Affect: Mood and affect and mood normal.        Behavior: Behavior normal.        Thought Content: Thought content normal.        Judgment: Judgment normal.      LABS: Recent Results (from the past 2160 hour(s))  POCT Urinalysis Dipstick     Status: Abnormal   Collection Time: 01/22/20  2:34 PM  Result Value Ref Range   Color, UA     Clarity, UA     Glucose, UA Negative Negative   Bilirubin, UA neg    Ketones, UA neg    Spec Grav, UA <=1.005 (A) 1.010 - 1.025   Blood, UA neg    pH, UA 6.0 5.0 - 8.0   Protein, UA Negative Negative   Urobilinogen, UA 0.2 0.2 or 1.0 E.U./dL   Nitrite, UA neg     Leukocytes, UA Trace (A) Negative   Appearance     Odor    CULTURE, URINE COMPREHENSIVE     Status: None   Collection Time: 01/22/20  4:47 PM   Specimen: Urine   Urine  Result Value Ref Range   Urine Culture, Comprehensive Final report    Organism ID, Bacteria Lactobacillus species     Comment: 25,000-50,000 colony forming units per mL Susceptibility not normally performed on this organism.     . Assessment/Plan: 1. Encounter for general adult medical examination with abnormal findings Annual health maintenance  exam today. Order slip given to have routine, fasting labs drawn. Including anemia panel and check of reproductive hormones.   2. Other fatigue Check thyroid and anemia panels. Treat as indicated.   3. Menopausal and perimenopausal disorder Check reproductive hormones. Treat for any abnormalities.   4. Mild episode of recurrent major depressive disorder (HCC) Improved. Continue with wellbutrin at 75mg  every morning. May increase to twice daily as needed and as indicated.   5. Dysuria - UA/M w/rflx Culture, Routine  General Counseling: Maridel verbalizes understanding of the findings of todays visit and agrees with plan of treatment. I have discussed any further diagnostic evaluation that may be needed or ordered today. We also reviewed her medications today. she has been encouraged to call the office with any questions or concerns that should arise related to todays visit.    Counseling:  This patient was seen by French Ana FNP Collaboration with Dr Vincent Gros as a part of collaborative care agreement  Orders Placed This Encounter  Procedures  . UA/M w/rflx Culture, Routine    Meds ordered this encounter  Medications  . buPROPion (WELLBUTRIN) 75 MG tablet    Sig: Take 1 tablet (75 mg total) by mouth 2 (two) times daily.    Dispense:  180 tablet    Refill:  1    Written prescription administered today    Order Specific Question:   Supervising Provider     Answer:   Lyndon Code [[id number]]    Total time spent: 30 Minutes  Time spent includes review of chart, medications, test results, and follow up plan with the patient.     Lyndon Code, MD  Internal Medicine

## 2020-04-04 LAB — UA/M W/RFLX CULTURE, ROUTINE
Bilirubin, UA: NEGATIVE
Glucose, UA: NEGATIVE
Ketones, UA: NEGATIVE
Nitrite, UA: NEGATIVE
Protein,UA: NEGATIVE
RBC, UA: NEGATIVE
Specific Gravity, UA: 1.007 (ref 1.005–1.030)
Urobilinogen, Ur: 0.2 mg/dL (ref 0.2–1.0)
pH, UA: 6.5 (ref 5.0–7.5)

## 2020-04-04 LAB — MICROSCOPIC EXAMINATION
Bacteria, UA: NONE SEEN
Casts: NONE SEEN /lpf
RBC, Urine: NONE SEEN /hpf (ref 0–2)

## 2020-04-04 LAB — URINE CULTURE, REFLEX

## 2020-04-06 ENCOUNTER — Encounter: Payer: Self-pay | Admitting: Nurse Practitioner

## 2020-07-10 ENCOUNTER — Telehealth: Payer: Self-pay

## 2020-07-10 NOTE — Telephone Encounter (Signed)
error 

## 2020-07-10 NOTE — Telephone Encounter (Signed)
Pt returned my call  I was able to collect the information I need to start processing the paperwork  I informed pt I would call when completed paperwork gets faxed

## 2020-07-10 NOTE — Telephone Encounter (Signed)
LVM for pt to call me regarding FMLA paperwork

## 2020-07-29 ENCOUNTER — Other Ambulatory Visit: Payer: Self-pay | Admitting: Nurse Practitioner

## 2020-07-29 DIAGNOSIS — K219 Gastro-esophageal reflux disease without esophagitis: Secondary | ICD-10-CM

## 2020-07-29 DIAGNOSIS — J301 Allergic rhinitis due to pollen: Secondary | ICD-10-CM

## 2020-08-03 ENCOUNTER — Ambulatory Visit: Payer: Managed Care, Other (non HMO) | Admitting: Hospice and Palliative Medicine

## 2020-08-05 ENCOUNTER — Other Ambulatory Visit: Payer: Self-pay | Admitting: Nurse Practitioner

## 2020-08-05 DIAGNOSIS — K219 Gastro-esophageal reflux disease without esophagitis: Secondary | ICD-10-CM

## 2020-08-05 DIAGNOSIS — J301 Allergic rhinitis due to pollen: Secondary | ICD-10-CM

## 2020-10-07 ENCOUNTER — Ambulatory Visit: Payer: Managed Care, Other (non HMO) | Admitting: Nurse Practitioner

## 2020-10-07 ENCOUNTER — Encounter: Payer: Self-pay | Admitting: Nurse Practitioner

## 2020-10-07 ENCOUNTER — Other Ambulatory Visit: Payer: Self-pay

## 2020-10-07 VITALS — BP 119/77 | HR 57 | Temp 97.7°F | Ht 62.0 in | Wt 185.3 lb

## 2020-10-07 DIAGNOSIS — F988 Other specified behavioral and emotional disorders with onset usually occurring in childhood and adolescence: Secondary | ICD-10-CM

## 2020-10-07 DIAGNOSIS — Z6833 Body mass index (BMI) 33.0-33.9, adult: Secondary | ICD-10-CM

## 2020-10-07 DIAGNOSIS — F330 Major depressive disorder, recurrent, mild: Secondary | ICD-10-CM | POA: Diagnosis not present

## 2020-10-07 DIAGNOSIS — R5383 Other fatigue: Secondary | ICD-10-CM | POA: Diagnosis not present

## 2020-10-07 DIAGNOSIS — Z1231 Encounter for screening mammogram for malignant neoplasm of breast: Secondary | ICD-10-CM

## 2020-10-07 DIAGNOSIS — F411 Generalized anxiety disorder: Secondary | ICD-10-CM | POA: Diagnosis not present

## 2020-10-07 MED ORDER — SERTRALINE HCL 25 MG PO TABS: 25.0000 mg | ORAL_TABLET | Freq: Every day | ORAL | 1 refills | Status: DC

## 2020-10-07 MED ORDER — LISDEXAMFETAMINE DIMESYLATE 30 MG PO CAPS: 30.0000 mg | ORAL_CAPSULE | Freq: Every day | ORAL | 0 refills | Status: DC

## 2020-10-07 NOTE — Telephone Encounter (Signed)
Hey. Do you know how we can get the prior authorization for vyvanse for her from [facility] or from her insurance?

## 2020-10-07 NOTE — Progress Notes (Signed)
New Patient Office Visit  Subjective:  Patient ID: Jennifer Branch, female    DOB: [birth year]  Age: 55 y.o. MRN: [id number]  CC:  Chief Complaint  Patient presents with   New Patient (Initial Visit)    HPI Jennifer Branch presents to establish primary care in new office. She has chosen to change offices and continue seeing prior PCP to maintain the continuity of care. She is concerned about increased personal stress. She states that her father came to live with her in March 2022. She became the primary caregiver for him after he had COVID 19. He developed PAD, CHF, and COPD after having COVID 19. The patient took time off via FMLA in order to care for her dad. She states that he has had six hospitalizations since having COVID 19. He had to have a toe amputation and is today, having a partial foot amputation due to consistently forming clots and wounds that will not heal as a result. The patient states that she did go back to work two weeks ago. She is having increased stress trying to balance caring for her dad, working, teaching wellness classes, and her own health. She currently takes wellbutrin 75mg  once daily. With the increased stress, she did try taking a second dose of wellbutrin. She states that increasing the dose just made her mean and irritable. She states that she is having a lot of trouble with concentration. She bounces from one thought to the other very quickly. She is not finishing tasks before moving on to another one. She states that this is increasing her stress even more. She took ASRS 1.1 today.   Adult ADHD Self Report Scale (most recent)     Adult ADHD Self-Report Scale (ASRS-v1.1) Symptom Checklist - 10/07/20 1030       Part A   1. How often do you have trouble wrapping up the final details of a project, once the challenging parts have been done? Sometimes  2. How often do you have difficulty getting things done in order when you have to do a task that requires  organization? Often    3. How often do you have problems remembering appointments or obligations? Sometimes  4. When you have a task that requires a lot of thought, how often do you avoid or delay getting started? Very Often    5. How often do you fidget or squirm with your hands or feet when you have to sit down for a long time? Very Often  6. How often do you feel overly active and compelled to do things, like you were driven by a motor? Often      Part B   7. How often do you make careless mistakes when you have to work on a boring or difficult project? Very Often  8. How often do you have difficulty keeping your attention when you are doing boring or repetitive work? Very Often    9. How often do you have difficulty concentrating on what people say to you, even when they are speaking to you directly? Very Often  10. How often do you misplace or have difficulty finding things at home or at work? Sometimes    11. How often are you distracted by activity or noise around you? Very Often  12. How often do you leave your seat in meetings or other situations in which you are expected to remain seated? Sometimes    13. How often do you feel restless or fidgety?  Often  14. How often do you have difficulty unwinding and relaxing when you have time to yourself? Sometimes    15. How often do you find yourself talking too much when you are in social situations? Sometimes  16. When you are in a conversation, how often do you find yourself finishing the sentences of the people you are talking to, before they can finish them themselves? Sometimes    17. How often do you have difficulty waiting your turn in situations when turn taking is required? Sometimes  18. How often do you interrupt others when they are busy? Rarely      Comment   How old were you when these problems first began to occur? 50                She states that she has been using food as a coping mechanism. Finds that it comforts her to eat  with all of the stress. Comfort is short lasting. She is concerned about weight gain due to this. She has worked very hard to successfully lose weight and does not want to undo all the progress she made regarding weight.  She is due to have screening mammogram  Past Medical History:  Diagnosis Date   Anxiety    GERD (gastroesophageal reflux disease)     Past Surgical History:  Procedure Laterality Date   BRACHIOPLASTY Left    BREAST BIOPSY Left 2011   neg   FOOT SURGERY Left    INNER THIGH LIFT     tummy tuck      Family History  Problem Relation Age of Onset   COPD Mother    Hypertension Father    Hodgkin's lymphoma Brother    Lung cancer Maternal Grandfather    Breast cancer Paternal Grandmother     Social History   Socioeconomic History   Marital status: Married    Spouse name: Imagene GurneyJulian Mordecai   Number of children: 2   Years of education: Not on file   Highest education level: Not on file  Occupational History   Not on file  Tobacco Use   Smoking status: Former    Pack years: 0.00   Smokeless tobacco: Never  Vaping Use   Vaping Use: Never used  Substance and Sexual Activity   Alcohol use: Yes    Comment: occ.   Drug use: Never   Sexual activity: Yes    Birth control/protection: None  Other Topics Concern   Not on file  Social History Narrative   Not on file   Social Determinants of Health   Financial Resource Strain: Not on file  Food Insecurity: Not on file  Transportation Needs: Not on file  Physical Activity: Not on file  Stress: Not on file  Social Connections: Not on file  Intimate Partner Violence: Not on file    ROS Review of Systems  Constitutional:  Positive for fatigue and unexpected weight change. Negative for activity change, chills and fever.  HENT:  Negative for congestion, postnasal drip, rhinorrhea and sneezing.   Eyes: Negative.   Respiratory:  Negative for cough, chest tightness, shortness of breath and wheezing.    Cardiovascular:  Negative for chest pain and palpitations.  Gastrointestinal:  Negative for constipation, diarrhea, nausea and vomiting.  Endocrine: Negative for cold intolerance, heat intolerance and polyuria.  Musculoskeletal: Negative.  Negative for back pain and myalgias.  Skin:  Negative for rash.  Allergic/Immunologic: Negative.   Neurological:  Negative for dizziness, weakness and headaches.  Psychiatric/Behavioral:  Positive for decreased concentration, dysphoric mood and sleep disturbance. The patient is nervous/anxious.    Objective:   Today's Vitals   10/07/20 0947 10/07/20 1032  BP: (!) 152/69 119/77  Pulse: 74 (!) 57  Temp: 97.7 F (36.5 C)   SpO2: 100%   Weight: 185 lb 4.8 oz (84.1 kg)   Height: 5\' 2"  (1.575 m)    Body mass index is 33.89 kg/m.   Physical Exam Vitals and nursing note reviewed.  Constitutional:      Appearance: Normal appearance. She is well-developed.  HENT:     Head: Normocephalic and atraumatic.  Eyes:     Extraocular Movements: Extraocular movements intact.     Conjunctiva/sclera: Conjunctivae normal.     Pupils: Pupils are equal, round, and reactive to light.  Cardiovascular:     Rate and Rhythm: Normal rate and regular rhythm.     Pulses: Normal pulses.     Heart sounds: Normal heart sounds.  Pulmonary:     Effort: Pulmonary effort is normal.     Breath sounds: Normal breath sounds.  Abdominal:     Palpations: Abdomen is soft.  Musculoskeletal:        General: Normal range of motion.     Cervical back: Normal range of motion and neck supple.  Lymphadenopathy:     Cervical: No cervical adenopathy.  Skin:    General: Skin is warm and dry.     Capillary Refill: Capillary refill takes less than 2 seconds.  Neurological:     General: No focal deficit present.     Mental Status: She is alert and oriented to person, place, and time.  Psychiatric:        Attention and Perception: Perception normal. She is inattentive.        Mood  and Affect: Affect normal. Mood is anxious.        Speech: Speech normal.        Behavior: Behavior normal. Behavior is cooperative.        Thought Content: Thought content normal.        Cognition and Memory: Cognition and memory normal.        Judgment: Judgment normal.    Assessment & Plan:  1. Other fatigue Worsening fatigue is likely due to increased personal stress and balancing work with caring for parent at home  2. Adult attention deficit disorder Patient scored 6/6 on ASRS 1.1 at today's visit. This may be adding to patient's feelings of overwhelm and anxiety. Trial of Vyvanse 30mg  daily. Will reassess in one month and adjust dosing and medication as indicated.  - lisdexamfetamine (VYVANSE) 30 MG capsule; Take 1 capsule (30 mg total) by mouth daily.  Dispense: 30 capsule; Refill: 0  3. Mild episode of recurrent major depressive disorder (HCC) Addition of wellbutrin may be worsening depression. Will d/c wellbutrin and do trial of sertraline 25mg  every evening. She has been on this medication in the past and has done well. Consider consultation with counselor if indicated.   4. Generalized anxiety disorder D/c wellbutrin. Trial of sertraline 26mg  every evening. Reassess at next visit. - sertraline (ZOLOFT) 25 MG tablet; Take 1 tablet (25 mg total) by mouth daily.  Dispense: 30 tablet; Refill: 1  5. Body mass index (BMI) of 33.0-33.9 in adult Discussion about over eating and using food as coping mechanism to deal with increased stress. Prescription for Vyvanse may help this. In meantime, advised a 1200 calorie diet and increased physical activity in daily  routine.   6. Screening mammogram for breast cancer Screening mammogram ordered. - MM DIGITAL SCREENING BILATERAL; Future   Problem List Items Addressed This Visit       Other   Mild episode of recurrent major depressive disorder (HCC)   Relevant Medications   sertraline (ZOLOFT) 25 MG tablet   Other fatigue - Primary    Adult attention deficit disorder   Relevant Medications   lisdexamfetamine (VYVANSE) 30 MG capsule   Generalized anxiety disorder   Relevant Medications   sertraline (ZOLOFT) 25 MG tablet   Body mass index (BMI) of 33.0-33.9 in adult   Screening mammogram for breast cancer   Relevant Orders   MM DIGITAL SCREENING BILATERAL    Outpatient Encounter Medications as of 10/07/2020  Medication Sig   Black Cohosh 40 MG CAPS Take by mouth.   Glucos-Chond-MSM-Bor-D3-Hyalur (MOVE FREE JOINT HEALTH ADV + D) TABS Take by mouth.   lisdexamfetamine (VYVANSE) 30 MG capsule Take 1 capsule (30 mg total) by mouth daily.   montelukast (SINGULAIR) 10 MG tablet Take 1 tablet (10 mg total) by mouth at bedtime.   omeprazole (PRILOSEC) 10 MG capsule Take 1 capsule (10 mg total) by mouth daily.   Probiotic Product (PROBIOTIC DAILY PO) Take by mouth.   sertraline (ZOLOFT) 25 MG tablet Take 1 tablet (25 mg total) by mouth daily.   [DISCONTINUED] buPROPion (WELLBUTRIN) 75 MG tablet Take 1 tablet (75 mg total) by mouth 2 (two) times daily.   Facility-Administered Encounter Medications as of 10/07/2020  Medication   betamethasone acetate-betamethasone sodium phosphate (CELESTONE) injection 3 mg   betamethasone acetate-betamethasone sodium phosphate (CELESTONE) injection 3 mg    Follow-up: Return in about 4 weeks (around 11/04/2020) for mood - sertraline/vyvanse .   Carlean Jews, NP

## 2020-10-08 ENCOUNTER — Telehealth: Payer: Self-pay | Admitting: Nurse Practitioner

## 2020-10-08 DIAGNOSIS — F988 Other specified behavioral and emotional disorders with onset usually occurring in childhood and adolescence: Secondary | ICD-10-CM | POA: Insufficient documentation

## 2020-10-08 DIAGNOSIS — Z6832 Body mass index (BMI) 32.0-32.9, adult: Secondary | ICD-10-CM | POA: Insufficient documentation

## 2020-10-08 DIAGNOSIS — F411 Generalized anxiety disorder: Secondary | ICD-10-CM | POA: Insufficient documentation

## 2020-10-08 DIAGNOSIS — Z6833 Body mass index (BMI) 33.0-33.9, adult: Secondary | ICD-10-CM | POA: Insufficient documentation

## 2020-10-08 DIAGNOSIS — Z1231 Encounter for screening mammogram for malignant neoplasm of breast: Secondary | ICD-10-CM | POA: Insufficient documentation

## 2020-10-08 DIAGNOSIS — F419 Anxiety disorder, unspecified: Secondary | ICD-10-CM | POA: Insufficient documentation

## 2020-10-08 NOTE — Patient Instructions (Signed)

## 2020-10-08 NOTE — Telephone Encounter (Addendum)
Patient states she needs a prior authorization for her Vyvanse.  Please advise, thanks.

## 2020-10-08 NOTE — Telephone Encounter (Signed)
Called patient to notify her the PA has been submitted and to take her new Zoloft prescription as directed by her PCP.

## 2020-10-08 NOTE — Telephone Encounter (Signed)
I just filled out initial prior authorization request under cover my meds. Will take 24-72 hours to hear back.

## 2020-10-10 ENCOUNTER — Other Ambulatory Visit: Payer: Self-pay | Admitting: Nurse Practitioner

## 2020-10-10 DIAGNOSIS — F330 Major depressive disorder, recurrent, mild: Secondary | ICD-10-CM

## 2020-11-04 ENCOUNTER — Other Ambulatory Visit: Payer: Self-pay | Admitting: Nurse Practitioner

## 2020-11-04 ENCOUNTER — Other Ambulatory Visit: Payer: Self-pay

## 2020-11-04 ENCOUNTER — Ambulatory Visit: Payer: Managed Care, Other (non HMO) | Admitting: Nurse Practitioner

## 2020-11-04 ENCOUNTER — Encounter: Payer: Self-pay | Admitting: Nurse Practitioner

## 2020-11-04 VITALS — BP 100/62 | HR 65 | Temp 97.4°F | Ht 62.0 in | Wt 175.5 lb

## 2020-11-04 DIAGNOSIS — F411 Generalized anxiety disorder: Secondary | ICD-10-CM | POA: Diagnosis not present

## 2020-11-04 DIAGNOSIS — Z6832 Body mass index (BMI) 32.0-32.9, adult: Secondary | ICD-10-CM

## 2020-11-04 DIAGNOSIS — F988 Other specified behavioral and emotional disorders with onset usually occurring in childhood and adolescence: Secondary | ICD-10-CM | POA: Diagnosis not present

## 2020-11-04 DIAGNOSIS — Z1231 Encounter for screening mammogram for malignant neoplasm of breast: Secondary | ICD-10-CM

## 2020-11-04 DIAGNOSIS — R300 Dysuria: Secondary | ICD-10-CM

## 2020-11-04 LAB — POCT URINALYSIS DIP (CLINITEK)
Bilirubin, UA: NEGATIVE
Blood, UA: NEGATIVE
Glucose, UA: NEGATIVE mg/dL
Ketones, POC UA: NEGATIVE mg/dL
Leukocytes, UA: NEGATIVE
Nitrite, UA: NEGATIVE
POC PROTEIN,UA: NEGATIVE
Spec Grav, UA: <=1.005 — AB (ref 1.010–1.025)
Urobilinogen, UA: 0.2 E.U./dL
pH, UA: 5.5 (ref 5.0–8.0)

## 2020-11-04 MED ORDER — SERTRALINE HCL 25 MG PO TABS: 25.0000 mg | ORAL_TABLET | Freq: Every day | ORAL | 1 refills | Status: DC

## 2020-11-04 MED ORDER — LISDEXAMFETAMINE DIMESYLATE 40 MG PO CAPS: 40.0000 mg | ORAL_CAPSULE | Freq: Every day | ORAL | 0 refills | Status: DC

## 2020-11-04 NOTE — Progress Notes (Signed)
Established Patient Office Visit  Subjective:  Patient ID: Jennifer Branch, female    DOB: 08/24/1965  Age: 55 y.o. MRN: [id number]  CC:  Chief Complaint  Patient presents with   Follow-up    HPI [name] presents for follow-up visit.  She states that she has been having some back and flank pains since Saturday.  Potentially felt like she was getting a urinary tract infection.  Urine sample was negative for infection or other abnormalities today.  At her most recent visit we started sertraline 25 mg tablets every evening.  She was also started on Vyvanse 30 mg daily.  She states that this medication combination has helped her tremendously.  She is able to focus on work tasks and get 1 task done before moving onto the next.  She states her brain is not jumping from 1 thought to another.  She states colleagues are able to follow her conversations much more easily.  She states she could potentially noticed that it is wearing off a little bit earlier in the afternoon and she would like.  She states the addition of sertraline has helped her to process the transition of having her daughter living with her and having him be nearly full care for her.  She states he is improving and doing better than expected now.  The patient states this is likely helped her overall depression.  She states she is less irritable.  She is stepping less tight husband and family.  She reports that she is sleeping better.  Her PHQ score is unchanged, though score is 0.  I did review her PDMP profile today.  Her overdose risk score is 280.  She did recently have surgery and was on opioid narcotics postoperative period.  She has no longer on any narcotic medications at this time.  Her last fill of Vyvanse 30 mg capsules was 09/29/2020.  Depression screen [facility] 2/9 11/04/2020 10/07/2020 04/02/2020 01/31/2020 01/22/2020  Decreased Interest 0 0 0 0 0  Down, Depressed, Hopeless 0 0 0 0 0  PHQ - 2 Score 0 0 0 0 0  Altered sleeping  0 0 - - -  Tired, decreased energy 0 0 - - -  Change in appetite 0 0 - - -  Feeling bad or failure about yourself  0 0 - - -  Trouble concentrating 0 0 - - -  Moving slowly or fidgety/restless 0 0 - - -  Suicidal thoughts 0 0 - - -  PHQ-9 Score 0 0 - - -   She reports no other concerns or complaints today.  Shedenies chest pain, chest pressure, or shortness of breath. She denies headaches or visual disturbances. She denies abdominal pain, nausea, vomiting, or changes in bowel or bladder habits.    Past Medical History:  Diagnosis Date   Anxiety    GERD (gastroesophageal reflux disease)     Past Surgical History:  Procedure Laterality Date   BRACHIOPLASTY Left    BREAST BIOPSY Left 2011   neg   FOOT SURGERY Left    INNER THIGH LIFT     tummy tuck      Family History  Problem Relation Age of Onset   COPD Mother    Hypertension Father    Hodgkin's lymphoma Brother    Lung cancer Maternal Grandfather    Breast cancer Paternal Grandmother     Social History   Socioeconomic History   Marital status: Married    Spouse name: Jacquenette Shone  [place]   Number of children: 2   Years of education: Not on file   Highest education level: Not on file  Occupational History   Not on file  Tobacco Use   Smoking status: Former   Smokeless tobacco: Never  Vaping Use   Vaping Use: Never used  Substance and Sexual Activity   Alcohol use: Yes    Comment: occ.   Drug use: Never   Sexual activity: Yes    Birth control/protection: None  Other Topics Concern   Not on file  Social History Narrative   Not on file   Social Determinants of Health   Financial Resource Strain: Not on file  Food Insecurity: Not on file  Transportation Needs: Not on file  Physical Activity: Not on file  Stress: Not on file  Social Connections: Not on file  Intimate Partner Violence: Not on file    Outpatient Medications Prior to Visit  Medication Sig Dispense Refill   Black Cohosh 40 MG CAPS Take by  mouth.     Glucos-Chond-MSM-Bor-D3-Hyalur (MOVE FREE JOINT HEALTH ADV + D) TABS Take by mouth.     montelukast (SINGULAIR) 10 MG tablet Take 1 tablet (10 mg total) by mouth at bedtime. 90 tablet 1   omeprazole (PRILOSEC) 10 MG capsule Take 1 capsule (10 mg total) by mouth daily. 90 capsule 1   Probiotic Product (PROBIOTIC DAILY PO) Take by mouth.     lisdexamfetamine (VYVANSE) 30 MG capsule Take 1 capsule (30 mg total) by mouth daily. 30 capsule 0   sertraline (ZOLOFT) 25 MG tablet Take 1 tablet (25 mg total) by mouth daily. 30 tablet 1   Facility-Administered Medications Prior to Visit  Medication Dose Route Frequency Provider Last Rate Last Admin   betamethasone acetate-betamethasone sodium phosphate (CELESTONE) injection 3 mg  3 mg Intramuscular Once Gala Lewandowsky M, DPM       betamethasone acetate-betamethasone sodium phosphate (CELESTONE) injection 3 mg  3 mg Intramuscular Once Felecia Shelling, DPM        Allergies  Allergen Reactions   Crab [Shellfish Allergy] Swelling and Other (See Comments)    Crab only no issues with any other shell fish happened when a child not certain of details   Penicillins     Does not recall reaction happened when she was a child     ROS Review of Systems  Constitutional:  Negative for activity change, chills, fatigue, fever and unexpected weight change.       She has had a 10 pound weight loss since her most recent visit.  Reports that she is no longer using food as a means of comfort and stress relief.  HENT:  Negative for congestion, postnasal drip, rhinorrhea and sneezing.   Eyes: Negative.   Respiratory:  Negative for cough, chest tightness, shortness of breath and wheezing.   Cardiovascular:  Negative for chest pain and palpitations.  Gastrointestinal:  Negative for constipation, diarrhea, nausea and vomiting.  Endocrine: Negative for cold intolerance, heat intolerance and polyuria.  Genitourinary:  Positive for flank pain.  Musculoskeletal:   Positive for back pain. Negative for myalgias.  Skin:  Negative for rash.  Allergic/Immunologic: Negative.   Neurological:  Negative for dizziness, weakness and headaches.  Psychiatric/Behavioral:  Positive for decreased concentration, dysphoric mood and sleep disturbance. The patient is nervous/anxious.        Patient was reporting increased concentration and focus.  She reports improvement in ability to finish tasks that work.  She states colleagues are  able to follow her thought processes not usually.  She reports improved irritability at home.  She is sleeping better.     Objective:    Physical Exam Vitals and nursing note reviewed.  Constitutional:      Appearance: Normal appearance. She is well-developed. She is obese.  HENT:     Head: Normocephalic and atraumatic.  Eyes:     Extraocular Movements: Extraocular movements intact.     Conjunctiva/sclera: Conjunctivae normal.     Pupils: Pupils are equal, round, and reactive to light.  Cardiovascular:     Rate and Rhythm: Normal rate and regular rhythm.     Pulses: Normal pulses.     Heart sounds: Normal heart sounds.  Pulmonary:     Effort: Pulmonary effort is normal.     Breath sounds: Normal breath sounds.  Abdominal:     General: Bowel sounds are normal.     Palpations: Abdomen is soft.     Tenderness: There is no abdominal tenderness.  Genitourinary:    Comments: Urine sample performed in the office today was negative for evidence of infection or other abnormality. Musculoskeletal:        General: Normal range of motion.     Cervical back: Normal range of motion and neck supple.  Lymphadenopathy:     Cervical: No cervical adenopathy.  Skin:    General: Skin is warm and dry.     Capillary Refill: Capillary refill takes less than 2 seconds.  Neurological:     General: No focal deficit present.     Mental Status: She is alert and oriented to person, place, and time.  Psychiatric:        Attention and Perception:  Attention and perception normal.        Mood and Affect: Affect normal. Mood is anxious.        Speech: Speech normal.        Behavior: Behavior normal. Behavior is cooperative.        Thought Content: Thought content normal.        Cognition and Memory: Cognition and memory normal.        Judgment: Judgment normal.     Comments: Improved Anxiety and attentiveness during visit.    Today's Vitals   11/04/20 1025  BP: 100/62  Pulse: 65  Temp: (!) 97.4 F (36.3 C)  SpO2: 99%  Weight: 175 lb 8 oz (79.6 kg)  Height: 5\' 2"  (1.575 m)   Body mass index is 32.1 kg/m.   Wt Readings from Last 3 Encounters:  11/04/20 175 lb 8 oz (79.6 kg)  10/07/20 185 lb 4.8 oz (84.1 kg)  04/02/20 174 lb 3.2 oz (79 kg)     Health Maintenance Due  Topic Date Due   HIV Screening  Never done   TETANUS/TDAP  Never done   COVID-19 Vaccine (3 - Booster for Moderna series) 03/16/2020   INFLUENZA VACCINE  11/16/2020    There are no preventive care reminders to display for this patient.  Lab Results  Component Value Date   TSH 1.110 02/27/2019   Lab Results  Component Value Date   WBC 4.8 02/27/2019   HGB 12.5 02/27/2019   HCT 36.7 02/27/2019   MCV 91 02/27/2019   PLT 212 02/27/2019   Lab Results  Component Value Date   NA 138 02/27/2019   K 4.2 02/27/2019   CO2 23 02/27/2019   GLUCOSE 78 02/27/2019   BUN 16 02/27/2019   CREATININE 0.95 02/27/2019  BILITOT 0.3 02/27/2019   ALKPHOS 47 02/27/2019   AST 28 02/27/2019   ALT 23 02/27/2019   PROT 6.5 02/27/2019   ALBUMIN 4.3 02/27/2019   CALCIUM 9.1 02/27/2019   ANIONGAP 4 (L) 02/12/2013   Lab Results  Component Value Date   CHOL 205 (H) 02/27/2019   Lab Results  Component Value Date   HDL 88 02/27/2019   Lab Results  Component Value Date   LDLCALC 110 (H) 02/27/2019   Lab Results  Component Value Date   TRIG 39 02/27/2019   No results found for: [facility] Lab Results  Component Value Date   HGBA1C 4.9 02/27/2019       Assessment & Plan:  1. Dysuria Intermittent episodes of flank pain since Saturday.  Urine sample negative for infection or other abnormality today.  Flank pain likely musculoskeletal in nature.  Will monitor. - POCT URINALYSIS DIP (CLINITEK)  2. Body mass index (BMI) of 32.0-32.9 in adult Improving.  Patient has had a 10 pound weight loss since her most recent visit 10/07/2020.  She is limiting her calorie intake, no longer using food as stress management.  Back to normal exercise routine.  Will monitor weight over next 3 months.  3. Adult attention deficit disorder Improved focus and attention since most recent visit.  We will do small increase to Vyvanse 40 mg capsules.  May take daily as needed for focus and concentration.  3 separate 30-day prescriptions were sent to her pharmacy today.  Dates are 11/12/2020, 12/03/2020, and 01/01/2021.  Patient will need to sign a controlled substances agreement for primary care at [facility] at her next visit. - lisdexamfetamine (VYVANSE) 40 MG capsule; Take 1 capsule (40 mg total) by mouth daily.  Dispense: 30 capsule; Refill: 0  4. Generalized anxiety disorder Improved.  We will continue Zoloft 25 mg tablets every evening.  Renewed prescription with 90-day supply. - sertraline (ZOLOFT) 25 MG tablet; Take 1 tablet (25 mg total) by mouth daily.  Dispense: 90 tablet; Refill: 1   Problem List Items Addressed This Visit       Other   Dysuria - Primary   Relevant Orders   POCT URINALYSIS DIP (CLINITEK) (Completed)   Adult attention deficit disorder   Relevant Medications   lisdexamfetamine (VYVANSE) 40 MG capsule   Generalized anxiety disorder   Relevant Medications   sertraline (ZOLOFT) 25 MG tablet   Body mass index (BMI) of 32.0-32.9 in adult    Meds ordered this encounter  Medications   DISCONTD: lisdexamfetamine (VYVANSE) 40 MG capsule    Sig: Take 1 capsule (40 mg total) by mouth daily.    Dispense:  30 capsule    Refill:  0    Please  note increased dose    Order Specific Question:   Supervising Provider    Answer:   Nani Gasser D [[id number]]   sertraline (ZOLOFT) 25 MG tablet    Sig: Take 1 tablet (25 mg total) by mouth daily.    Dispense:  90 tablet    Refill:  1    Order Specific Question:   Supervising Provider    Answer:   Nani Gasser D [[id number]]   DISCONTD: lisdexamfetamine (VYVANSE) 40 MG capsule    Sig: Take 1 capsule (40 mg total) by mouth daily.    Dispense:  30 capsule    Refill:  0    Fill on or after 12/03/2020    Order Specific Question:   Supervising Provider    Answer:  METHENEY, CATHERINE D [[id number]]   lisdexamfetamine (VYVANSE) 40 MG capsule    Sig: Take 1 capsule (40 mg total) by mouth daily.    Dispense:  30 capsule    Refill:  0    Fill on or after 01/01/2021    Order Specific Question:   Supervising Provider    Answer:   Nani Gasser D [[id number]]    Follow-up: Return in about 3 months (around 02/04/2021) for routine - weight management, ADD.   This note was dictated using Conservation officer, historic buildings. Rapid proofreading was performed to expedite the delivery of the information. Despite proofreading, phonetic errors will occur which are common with this voice recognition software. Please take this into consideration. If there are any concerns, please contact our office.    Carlean Jews, NP

## 2020-11-17 ENCOUNTER — Ambulatory Visit
Admission: RE | Admit: 2020-11-17 | Discharge: 2020-11-17 | Disposition: A | Discharge status: Home / Self Care | Payer: Managed Care, Other (non HMO) | Source: Ambulatory Visit | Attending: Nurse Practitioner | Admitting: Nurse Practitioner

## 2020-11-17 ENCOUNTER — Other Ambulatory Visit: Payer: Self-pay

## 2020-11-17 DIAGNOSIS — Z1231 Encounter for screening mammogram for malignant neoplasm of breast: Secondary | ICD-10-CM | POA: Diagnosis present

## 2020-11-18 NOTE — Patient Instructions (Signed)

## 2020-11-20 ENCOUNTER — Other Ambulatory Visit: Payer: Self-pay | Admitting: Nurse Practitioner

## 2020-11-20 DIAGNOSIS — R928 Other abnormal and inconclusive findings on diagnostic imaging of breast: Secondary | ICD-10-CM

## 2020-11-20 DIAGNOSIS — N6310 Unspecified lump in the right breast, unspecified quadrant: Secondary | ICD-10-CM

## 2020-11-22 NOTE — Progress Notes (Signed)
Additional images recommended. Will sign off on recommended orders.

## 2020-11-26 ENCOUNTER — Ambulatory Visit
Admission: RE | Admit: 2020-11-26 | Discharge: 2020-11-26 | Disposition: A | Discharge status: Home / Self Care | Payer: Managed Care, Other (non HMO) | Source: Ambulatory Visit | Attending: Nurse Practitioner | Admitting: Nurse Practitioner

## 2020-11-26 ENCOUNTER — Other Ambulatory Visit: Payer: Self-pay

## 2020-11-26 ENCOUNTER — Other Ambulatory Visit: Payer: Self-pay | Admitting: Nurse Practitioner

## 2020-11-26 DIAGNOSIS — R928 Other abnormal and inconclusive findings on diagnostic imaging of breast: Secondary | ICD-10-CM

## 2020-11-26 DIAGNOSIS — N6310 Unspecified lump in the right breast, unspecified quadrant: Secondary | ICD-10-CM | POA: Insufficient documentation

## 2020-11-26 NOTE — Progress Notes (Signed)
Will be scheduled for stereotactic biopsy for new, small mass in right breast

## 2020-11-26 NOTE — Progress Notes (Signed)
Recommended stereotactic biopsy of right breast

## 2020-12-01 ENCOUNTER — Ambulatory Visit
Admission: RE | Admit: 2020-12-01 | Discharge: 2020-12-01 | Disposition: A | Discharge status: Home / Self Care | Payer: Managed Care, Other (non HMO) | Source: Ambulatory Visit | Attending: Nurse Practitioner | Admitting: Nurse Practitioner

## 2020-12-01 ENCOUNTER — Other Ambulatory Visit: Payer: Self-pay

## 2020-12-01 DIAGNOSIS — N6310 Unspecified lump in the right breast, unspecified quadrant: Secondary | ICD-10-CM

## 2020-12-01 DIAGNOSIS — R928 Other abnormal and inconclusive findings on diagnostic imaging of breast: Secondary | ICD-10-CM | POA: Diagnosis not present

## 2020-12-01 HISTORY — PX: BREAST BIOPSY: SHX20

## 2020-12-01 NOTE — Progress Notes (Signed)
Biopsy done. Waiting on pathology report

## 2020-12-01 NOTE — Progress Notes (Signed)
Biopsy marker placed appropriately. Waiting on pathology report

## 2020-12-02 LAB — SURGICAL PATHOLOGY

## 2020-12-22 ENCOUNTER — Telehealth: Payer: Self-pay | Admitting: Nurse Practitioner

## 2020-12-22 NOTE — Telephone Encounter (Signed)
Patient called office stating she has cut her finger and thinks she needs suture. Asking for apt in our office.   I advised patient our next available isn't until tomorrow afternoon and that we suggest she has it sutured within 24 hours. Patient advised to go to UC for evaluation, imaging and suturing. Pt verbalized understanding and was agreeable. AS, CMA

## 2021-01-11 ENCOUNTER — Other Ambulatory Visit: Payer: Self-pay | Admitting: Nurse Practitioner

## 2021-01-11 DIAGNOSIS — F988 Other specified behavioral and emotional disorders with onset usually occurring in childhood and adolescence: Secondary | ICD-10-CM

## 2021-01-11 MED ORDER — LISDEXAMFETAMINE DIMESYLATE 40 MG PO CAPS: 40.0000 mg | ORAL_CAPSULE | Freq: Every day | ORAL | 0 refills | Status: DC

## 2021-01-11 NOTE — Telephone Encounter (Signed)
Patient last seen in July for ADHD and has follow up in October. AS, CMA

## 2021-02-04 ENCOUNTER — Encounter: Payer: Self-pay | Admitting: Nurse Practitioner

## 2021-02-04 ENCOUNTER — Ambulatory Visit: Payer: Managed Care, Other (non HMO) | Admitting: Nurse Practitioner

## 2021-02-04 ENCOUNTER — Other Ambulatory Visit: Payer: Self-pay

## 2021-02-04 VITALS — BP 110/58 | HR 60 | Temp 97.6°F | Ht 62.0 in | Wt 161.6 lb

## 2021-02-04 DIAGNOSIS — F988 Other specified behavioral and emotional disorders with onset usually occurring in childhood and adolescence: Secondary | ICD-10-CM | POA: Diagnosis not present

## 2021-02-04 DIAGNOSIS — K4090 Unilateral inguinal hernia, without obstruction or gangrene, not specified as recurrent: Secondary | ICD-10-CM

## 2021-02-04 DIAGNOSIS — J301 Allergic rhinitis due to pollen: Secondary | ICD-10-CM | POA: Diagnosis not present

## 2021-02-04 DIAGNOSIS — Z6829 Body mass index (BMI) 29.0-29.9, adult: Secondary | ICD-10-CM

## 2021-02-04 DIAGNOSIS — F411 Generalized anxiety disorder: Secondary | ICD-10-CM | POA: Diagnosis not present

## 2021-02-04 MED ORDER — MONTELUKAST SODIUM 10 MG PO TABS: 10.0000 mg | ORAL_TABLET | Freq: Every day | ORAL | 1 refills | Status: DC

## 2021-02-04 MED ORDER — LISDEXAMFETAMINE DIMESYLATE 40 MG PO CAPS: 40.0000 mg | ORAL_CAPSULE | Freq: Every day | ORAL | 0 refills | Status: DC

## 2021-02-04 NOTE — Progress Notes (Signed)
Established Patient Office Visit  Subjective:  Patient ID: Jennifer Branch, female    DOB: [birth year]  Age: 55 y.o. MRN: [id number]  CC:  Chief Complaint  Patient presents with   Weight Check   Follow-up    HPI Jennifer Branch presents for follow up weight management and ADD. Currently on Vyvanse 40mg  daily. Feels as though she is doing well with this current dose. Does feel like she has to watch caffeine intake in the afternoon. Feels a bit jittery if she drinks caffeine in the afternoon while taking vyvanse. She states that her depression, anxiety, and problems wit focus and concentration are all improved since starting vyvanse and sertraline 25mg  daily. She has lost 14 pounds since she was last seen in the office.   Depression screen [facility] 2/9 02/04/2021 11/04/2020 10/07/2020 04/02/2020 01/31/2020  Decreased Interest 0 0 0 0 0  Down, Depressed, Hopeless 0 0 0 0 0  PHQ - 2 Score 0 0 0 0 0  Altered sleeping 0 0 0 - -  Tired, decreased energy 0 0 0 - -  Change in appetite 0 0 0 - -  Feeling bad or failure about yourself  0 0 0 - -  Trouble concentrating 0 0 0 - -  Moving slowly or fidgety/restless 0 0 0 - -  Suicidal thoughts 0 0 0 - -  PHQ-9 Score 0 0 0 - -    Has noted left inguinal hernia. States that as she loses weight, this is becoming more and more noticeable. Not tender at this time. Would like ot see surgeon for removal.  Patient has no other concerns or complaints today. She denies chest pain, chest pressure, or shortness of breath. She denies headaches or visual disturbances. She denies abdominal pain, nausea, vomiting, or changes in bowel or bladder habits.    Past Medical History:  Diagnosis Date   Anxiety    GERD (gastroesophageal reflux disease)     Past Surgical History:  Procedure Laterality Date   BRACHIOPLASTY Left    BREAST BIOPSY Left 2011   neg   BREAST BIOPSY Right 12/01/2020   stereo bx of mass, path pending, coil marker   FOOT SURGERY Left    INNER  THIGH LIFT     tummy tuck      Family History  Problem Relation Age of Onset   COPD Mother    Hypertension Father    Hodgkin's lymphoma Brother    Lung cancer Maternal Grandfather    Breast cancer Paternal Grandmother     Social History   Socioeconomic History   Marital status: Married    Spouse name: Raniah Karan   Number of children: 2   Years of education: Not on file   Highest education level: Not on file  Occupational History   Not on file  Tobacco Use   Smoking status: Former   Smokeless tobacco: Never  Vaping Use   Vaping Use: Never used  Substance and Sexual Activity   Alcohol use: Yes    Comment: occ.   Drug use: Never   Sexual activity: Yes    Birth control/protection: None  Other Topics Concern   Not on file  Social History Narrative   Not on file   Social Determinants of Health   Financial Resource Strain: Not on file  Food Insecurity: Not on file  Transportation Needs: Not on file  Physical Activity: Not on file  Stress: Not on file  Social Connections: Not on  file  Intimate Partner Violence: Not on file    Outpatient Medications Prior to Visit  Medication Sig Dispense Refill   Ascorbic Acid (VITAMIN C) 1000 MG tablet Vitamin C     Black Cohosh 40 MG CAPS Take by mouth.     Glucos-Chond-MSM-Bor-D3-Hyalur (MOVE FREE JOINT HEALTH ADV + D) TABS Take by mouth.     omeprazole (PRILOSEC) 10 MG capsule Take 1 capsule (10 mg total) by mouth daily. 90 capsule 1   Probiotic Product (PROBIOTIC DAILY PO) Take by mouth.     sertraline (ZOLOFT) 25 MG tablet Take 1 tablet (25 mg total) by mouth daily. 90 tablet 1   lisdexamfetamine (VYVANSE) 40 MG capsule Take 1 capsule (40 mg total) by mouth daily. 30 capsule 0   montelukast (SINGULAIR) 10 MG tablet Take 1 tablet (10 mg total) by mouth at bedtime. 90 tablet 1   Facility-Administered Medications Prior to Visit  Medication Dose Route Frequency Provider Last Rate Last Admin   betamethasone  acetate-betamethasone sodium phosphate (CELESTONE) injection 3 mg  3 mg Intramuscular Once Gala Lewandowsky M, DPM       betamethasone acetate-betamethasone sodium phosphate (CELESTONE) injection 3 mg  3 mg Intramuscular Once Felecia Shelling, DPM        Allergies  Allergen Reactions   Crab [Shellfish Allergy] Swelling and Other (See Comments)    Crab only no issues with any other shell fish happened when a child not certain of details   Penicillins     Does not recall reaction happened when she was a child     ROS Review of Systems  Constitutional:  Negative for activity change, appetite change, chills, fatigue and fever.       14 pound weight loss since most recent visit.   HENT:  Negative for congestion, postnasal drip, rhinorrhea, sinus pressure, sinus pain, sneezing and sore throat.   Eyes: Negative.   Respiratory:  Negative for cough, chest tightness, shortness of breath and wheezing.   Cardiovascular:  Negative for chest pain and palpitations.  Gastrointestinal:  Negative for abdominal pain, constipation, diarrhea, nausea and vomiting.       Noticeable left inguinal hernia present. Non tender.   Endocrine: Negative for cold intolerance, heat intolerance, polydipsia and polyuria.  Genitourinary:  Negative for dyspareunia, dysuria, flank pain, frequency and urgency.  Musculoskeletal:  Negative for arthralgias, back pain and myalgias.  Skin:  Negative for rash.  Allergic/Immunologic: Negative for environmental allergies.  Neurological:  Negative for dizziness, weakness and headaches.  Hematological:  Negative for adenopathy.  Psychiatric/Behavioral:  Positive for decreased concentration and dysphoric mood. The patient is nervous/anxious.        Improved anxiety, depression and focus problems since starting Vyvanse and sertraline.     Objective:    Physical Exam Vitals and nursing note reviewed.  Constitutional:      Appearance: Normal appearance. She is well-developed.  HENT:      Head: Normocephalic and atraumatic.     Nose: Nose normal.     Mouth/Throat:     Mouth: Mucous membranes are moist.  Eyes:     Extraocular Movements: Extraocular movements intact.     Conjunctiva/sclera: Conjunctivae normal.     Pupils: Pupils are equal, round, and reactive to light.  Cardiovascular:     Rate and Rhythm: Normal rate and regular rhythm.     Pulses: Normal pulses.     Heart sounds: Normal heart sounds.  Pulmonary:     Effort: Pulmonary effort is normal.  Breath sounds: Normal breath sounds.  Abdominal:     General: Bowel sounds are normal.     Palpations: Abdomen is soft.     Hernia: A hernia is present. Hernia is present in the left inguinal area.     Comments: There is moderate sized inguinal hernia in the left lower quadrant of the abdomen.  Nontender.  Musculoskeletal:        General: Normal range of motion.     Cervical back: Normal range of motion and neck supple.  Lymphadenopathy:     Cervical: No cervical adenopathy.  Skin:    General: Skin is warm and dry.     Capillary Refill: Capillary refill takes less than 2 seconds.  Neurological:     General: No focal deficit present.     Mental Status: She is alert and oriented to person, place, and time.  Psychiatric:        Mood and Affect: Mood normal.        Behavior: Behavior normal.        Thought Content: Thought content normal.        Judgment: Judgment normal.    Today's Vitals   02/04/21 1025  BP: (!) 110/58  Pulse: 60  Temp: 97.6 F (36.4 C)  SpO2: 100%  Weight: 161 lb 9.6 oz (73.3 kg)  Height: 5\' 2"  (1.575 m)   Body mass index is 29.56 kg/m.   Wt Readings from Last 3 Encounters:  02/04/21 161 lb 9.6 oz (73.3 kg)  11/04/20 175 lb 8 oz (79.6 kg)  10/07/20 185 lb 4.8 oz (84.1 kg)     Health Maintenance Due  Topic Date Due   HIV Screening  Never done   COVID-19 Vaccine (3 - Booster for Moderna series) 12/10/2019   PAP SMEAR-Modifier  01/23/2021    There are no preventive care  reminders to display for this patient.  Lab Results  Component Value Date   TSH 1.110 02/27/2019   Lab Results  Component Value Date   WBC 4.8 02/27/2019   HGB 12.5 02/27/2019   HCT 36.7 02/27/2019   MCV 91 02/27/2019   PLT 212 02/27/2019   Lab Results  Component Value Date   NA 138 02/27/2019   K 4.2 02/27/2019   CO2 23 02/27/2019   GLUCOSE 78 02/27/2019   BUN 16 02/27/2019   CREATININE 0.95 02/27/2019   BILITOT 0.3 02/27/2019   ALKPHOS 47 02/27/2019   AST 28 02/27/2019   ALT 23 02/27/2019   PROT 6.5 02/27/2019   ALBUMIN 4.3 02/27/2019   CALCIUM 9.1 02/27/2019   ANIONGAP 4 (L) 02/12/2013   Lab Results  Component Value Date   CHOL 205 (H) 02/27/2019   Lab Results  Component Value Date   HDL 88 02/27/2019   Lab Results  Component Value Date   LDLCALC 110 (H) 02/27/2019   Lab Results  Component Value Date   TRIG 39 02/27/2019   No results found for: [facility] Lab Results  Component Value Date   HGBA1C 4.9 02/27/2019      Assessment & Plan:  1. Left inguinal hernia Palpable left inguinal hernia.  Will refer to general surgery for further evaluation.  Patient has additional dyspnea at [facility]. - Ambulatory referral to General Surgery  2. Generalized anxiety disorder Improved.  Continue sertraline 25 mg daily.  3. Adult attention deficit disorder Patient doing well with current dose of Vyvanse 40 mg daily.  Continue as prescribed and as needed. Three 30-day prescription sent  to her pharmacy.  Dates are 02/04/2021, 03/05/2021, and 04/02/2021.  Reviewed her PDMP profile today.  Her overdose risk is 280.  Her fill history is appropriate. - lisdexamfetamine (VYVANSE) 40 MG capsule; Take 1 capsule (40 mg total) by mouth daily.  Dispense: 30 capsule; Refill: 0  4. Allergic rhinitis due to pollen, unspecified seasonality Continue Singulair daily. - montelukast (SINGULAIR) 10 MG tablet; Take 1 tablet (10 mg total) by mouth at bedtime.  Dispense: 90 tablet; Refill:  1  5. Body mass index 29.0-29.9, adult Patient continues to do well with weight management.  She has had a 14 pound weight loss since her most recent visit.  Continue Vyvanse 40 mg daily.  She limits calorie intake to 1200 cal/day.  She is very active and incorporates exercise into her daily routine.  Problem List Items Addressed This Visit       Respiratory   Allergic rhinitis due to pollen   Relevant Medications   montelukast (SINGULAIR) 10 MG tablet     Other   Adult attention deficit disorder   Relevant Medications   lisdexamfetamine (VYVANSE) 40 MG capsule   Generalized anxiety disorder   Left inguinal hernia - Primary   Relevant Orders   Ambulatory referral to General Surgery   Body mass index 29.0-29.9, adult    Meds ordered this encounter  Medications   DISCONTD: lisdexamfetamine (VYVANSE) 40 MG capsule    Sig: Take 1 capsule (40 mg total) by mouth daily.    Dispense:  30 capsule    Refill:  0    Order Specific Question:   Supervising Provider    Answer:   Nani Gasser D [[id number]]   montelukast (SINGULAIR) 10 MG tablet    Sig: Take 1 tablet (10 mg total) by mouth at bedtime.    Dispense:  90 tablet    Refill:  1    Order Specific Question:   Supervising Provider    Answer:   Nani Gasser D [[id number]]   DISCONTD: lisdexamfetamine (VYVANSE) 40 MG capsule    Sig: Take 1 capsule (40 mg total) by mouth daily.    Dispense:  30 capsule    Refill:  0    Fill on or after 03/05/2021    Order Specific Question:   Supervising Provider    Answer:   Nani Gasser D [[id number]]   lisdexamfetamine (VYVANSE) 40 MG capsule    Sig: Take 1 capsule (40 mg total) by mouth daily.    Dispense:  30 capsule    Refill:  0    Fill on or after 04/02/2021    Order Specific Question:   Supervising Provider    Answer:   Nani Gasser D [[id number]]    Follow-up: Return in about 3 months (around 05/07/2021) for ADD.    Carlean Jews, NP  This note was dictated using [employer]. Rapid proofreading was performed to expedite the delivery of the information. Despite proofreading, phonetic errors will occur which are common with this voice recognition software. Please take this into consideration. If there are any concerns, please contact our office.

## 2021-02-07 DIAGNOSIS — K4090 Unilateral inguinal hernia, without obstruction or gangrene, not specified as recurrent: Secondary | ICD-10-CM | POA: Insufficient documentation

## 2021-02-07 DIAGNOSIS — Z6829 Body mass index (BMI) 29.0-29.9, adult: Secondary | ICD-10-CM | POA: Insufficient documentation

## 2021-02-07 NOTE — Patient Instructions (Signed)

## 2021-03-18 ENCOUNTER — Other Ambulatory Visit: Payer: Self-pay

## 2021-03-18 DIAGNOSIS — F411 Generalized anxiety disorder: Secondary | ICD-10-CM

## 2021-03-18 MED ORDER — SERTRALINE HCL 25 MG PO TABS: 25.0000 mg | ORAL_TABLET | Freq: Every day | ORAL | 1 refills | Status: DC

## 2021-04-01 ENCOUNTER — Other Ambulatory Visit: Payer: Self-pay | Admitting: Nurse Practitioner

## 2021-04-01 DIAGNOSIS — F988 Other specified behavioral and emotional disorders with onset usually occurring in childhood and adolescence: Secondary | ICD-10-CM

## 2021-04-01 NOTE — Telephone Encounter (Signed)
Patient is requesting a refill of Vyvanse. AS, CMA

## 2021-04-01 NOTE — Telephone Encounter (Signed)
If she waits until tomorrow, the pharmacy will fill it. The fill date is 04/02/2021.

## 2021-04-29 ENCOUNTER — Other Ambulatory Visit: Payer: Self-pay

## 2021-04-29 ENCOUNTER — Ambulatory Visit: Payer: Managed Care, Other (non HMO) | Admitting: Nurse Practitioner

## 2021-04-29 ENCOUNTER — Encounter: Payer: Self-pay | Admitting: Nurse Practitioner

## 2021-04-29 VITALS — BP 118/80 | HR 63 | Temp 97.7°F | Ht 62.0 in | Wt 159.0 lb

## 2021-04-29 DIAGNOSIS — Z6829 Body mass index (BMI) 29.0-29.9, adult: Secondary | ICD-10-CM | POA: Diagnosis not present

## 2021-04-29 DIAGNOSIS — R051 Acute cough: Secondary | ICD-10-CM | POA: Diagnosis not present

## 2021-04-29 DIAGNOSIS — J0140 Acute pansinusitis, unspecified: Secondary | ICD-10-CM | POA: Diagnosis not present

## 2021-04-29 MED ORDER — AZITHROMYCIN 250 MG PO TABS: ORAL_TABLET | ORAL | 0 refills | Status: DC

## 2021-04-29 MED ORDER — BENZONATATE 200 MG PO CAPS: 200.0000 mg | ORAL_CAPSULE | Freq: Two times a day (BID) | ORAL | 0 refills | Status: DC | PRN

## 2021-04-29 NOTE — Progress Notes (Signed)
Acute Office Visit  Subjective:    Patient ID: Jennifer Branch, female    DOB: [birth year], 56 y.o.   MRN: [id number]  Chief Complaint  Patient presents with   Sore Throat    The patient has taken two home tests for COVID 19 tests, the most recent one being this morning. Both were negative .  Sore Throat  This is a new problem. The current episode started 1 to 4 weeks ago. The problem has been waxing and waning. Neither side of throat is experiencing more pain than the other. There has been no fever. Associated symptoms include congestion, coughing, ear pain, headaches and a hoarse voice. Pertinent negatives include no abdominal pain, diarrhea, shortness of breath or vomiting. Associated symptoms comments: Had ear pain two days ago but that has resolved . She has tried acetaminophen and NSAIDs (thep patient has been taking Theraflu for the apst few nights) for the symptoms. The treatment provided mild relief.    Past Medical History:  Diagnosis Date   Anxiety    GERD (gastroesophageal reflux disease)     Past Surgical History:  Procedure Laterality Date   BRACHIOPLASTY Left    BREAST BIOPSY Left 2011   neg   BREAST BIOPSY Right 12/01/2020   stereo bx of mass, path pending, coil marker   FOOT SURGERY Left    INNER THIGH LIFT     tummy tuck      Family History  Problem Relation Age of Onset   COPD Mother    Hypertension Father    Hodgkin's lymphoma Brother    Lung cancer Maternal Grandfather    Breast cancer Paternal Grandmother     Social History   Socioeconomic History   Marital status: Married    Spouse name: Imagene GurneyJulian Longfield   Number of children: 2   Years of education: Not on file   Highest education level: Not on file  Occupational History   Not on file  Tobacco Use   Smoking status: Former   Smokeless tobacco: Never  Vaping Use   Vaping Use: Never used  Substance and Sexual Activity   Alcohol use: Yes    Comment: occ.   Drug use: Never   Sexual  activity: Yes    Birth control/protection: None  Other Topics Concern   Not on file  Social History Narrative   Not on file   Social Determinants of Health   Financial Resource Strain: Not on file  Food Insecurity: Not on file  Transportation Needs: Not on file  Physical Activity: Not on file  Stress: Not on file  Social Connections: Not on file  Intimate Partner Violence: Not on file    Outpatient Medications Prior to Visit  Medication Sig Dispense Refill   Ascorbic Acid (VITAMIN C) 1000 MG tablet Vitamin C     Black Cohosh 40 MG CAPS Take by mouth.     Glucos-Chond-MSM-Bor-D3-Hyalur (MOVE FREE JOINT HEALTH ADV + D) TABS Take by mouth.     lisdexamfetamine (VYVANSE) 40 MG capsule Take 1 capsule (40 mg total) by mouth daily. 30 capsule 0   montelukast (SINGULAIR) 10 MG tablet Take 1 tablet (10 mg total) by mouth at bedtime. 90 tablet 1   omeprazole (PRILOSEC) 10 MG capsule Take 1 capsule (10 mg total) by mouth daily. 90 capsule 1   Probiotic Product (PROBIOTIC DAILY PO) Take by mouth.     sertraline (ZOLOFT) 25 MG tablet Take 1 tablet (25 mg total) by mouth daily. 90  tablet 1   Facility-Administered Medications Prior to Visit  Medication Dose Route Frequency Provider Last Rate Last Admin   betamethasone acetate-betamethasone sodium phosphate (CELESTONE) injection 3 mg  3 mg Intramuscular Once Gala Lewandowsky M, DPM       betamethasone acetate-betamethasone sodium phosphate (CELESTONE) injection 3 mg  3 mg Intramuscular Once Felecia Shelling, DPM        Allergies  Allergen Reactions   Crab [Shellfish Allergy] Swelling and Other (See Comments)    Crab only no issues with any other shell fish happened when a child not certain of details   Penicillins     Does not recall reaction happened when she was a child     Review of Systems  Constitutional:  Positive for activity change and fatigue. Negative for appetite change, chills and fever.  HENT:  Positive for congestion, ear pain,  hoarse voice, postnasal drip, rhinorrhea and sore throat. Negative for sinus pressure, sinus pain and sneezing.   Eyes: Negative.   Respiratory:  Positive for cough. Negative for chest tightness, shortness of breath and wheezing.   Cardiovascular:  Negative for chest pain and palpitations.       Blood pressure is mildly elevated today   Gastrointestinal:  Negative for abdominal pain, constipation, diarrhea, nausea and vomiting.  Endocrine: Negative for cold intolerance, heat intolerance, polydipsia and polyuria.  Genitourinary:  Negative for dyspareunia, dysuria, flank pain, frequency and urgency.  Musculoskeletal:  Negative for arthralgias, back pain and myalgias.  Skin:  Negative for rash.  Allergic/Immunologic: Negative for environmental allergies.  Neurological:  Positive for headaches. Negative for dizziness and weakness.  Hematological:  Positive for adenopathy.  Psychiatric/Behavioral:  The patient is not nervous/anxious.       Objective:    Physical Exam Vitals and nursing note reviewed.  Constitutional:      Appearance: Normal appearance. She is well-developed. She is ill-appearing.  HENT:     Head: Normocephalic.     Right Ear: Tympanic membrane is erythematous and bulging.     Left Ear: Tympanic membrane is erythematous and bulging.     Nose: Congestion present.     Right Sinus: Maxillary sinus tenderness and frontal sinus tenderness present.     Left Sinus: Maxillary sinus tenderness and frontal sinus tenderness present.     Mouth/Throat:     Pharynx: Posterior oropharyngeal erythema present.  Eyes:     Pupils: Pupils are equal, round, and reactive to light.  Cardiovascular:     Rate and Rhythm: Normal rate and regular rhythm.     Pulses: Normal pulses.     Heart sounds: Normal heart sounds.  Pulmonary:     Effort: Pulmonary effort is normal.     Breath sounds: Normal breath sounds.     Comments: Dry and harsh cough noted  Abdominal:     Palpations: Abdomen is  soft.  Musculoskeletal:        General: Normal range of motion.     Cervical back: Normal range of motion and neck supple.  Lymphadenopathy:     Cervical: Cervical adenopathy present.  Skin:    General: Skin is warm and dry.     Capillary Refill: Capillary refill takes less than 2 seconds.  Neurological:     General: No focal deficit present.     Mental Status: She is alert and oriented to person, place, and time.  Psychiatric:        Mood and Affect: Mood normal.  Behavior: Behavior normal.        Thought Content: Thought content normal.        Judgment: Judgment normal.   Today's Vitals   04/29/21 1120 04/29/21 1145  BP: (!) 146/80 118/80  Pulse: 63   Temp: 97.7 F (36.5 C)   SpO2: 96%   Weight: 159 lb (72.1 kg)   Height: 5\' 2"  (1.575 m)    Body mass index is 29.08 kg/m.   Wt Readings from Last 3 Encounters:  04/29/21 159 lb (72.1 kg)  02/04/21 161 lb 9.6 oz (73.3 kg)  11/04/20 175 lb 8 oz (79.6 kg)    Health Maintenance Due  Topic Date Due   HIV Screening  Never done   COVID-19 Vaccine (3 - Booster for Moderna series) 12/10/2019   PAP SMEAR-Modifier  01/23/2021    There are no preventive care reminders to display for this patient.   Lab Results  Component Value Date   TSH 1.110 02/27/2019   Lab Results  Component Value Date   WBC 4.8 02/27/2019   HGB 12.5 02/27/2019   HCT 36.7 02/27/2019   MCV 91 02/27/2019   PLT 212 02/27/2019   Lab Results  Component Value Date   NA 138 02/27/2019   K 4.2 02/27/2019   CO2 23 02/27/2019   GLUCOSE 78 02/27/2019   BUN 16 02/27/2019   CREATININE 0.95 02/27/2019   BILITOT 0.3 02/27/2019   ALKPHOS 47 02/27/2019   AST 28 02/27/2019   ALT 23 02/27/2019   PROT 6.5 02/27/2019   ALBUMIN 4.3 02/27/2019   CALCIUM 9.1 02/27/2019   ANIONGAP 4 (L) 02/12/2013   Lab Results  Component Value Date   CHOL 205 (H) 02/27/2019   Lab Results  Component Value Date   HDL 88 02/27/2019   Lab Results  Component Value  Date   LDLCALC 110 (H) 02/27/2019   Lab Results  Component Value Date   TRIG 39 02/27/2019   No results found for: [facility] Lab Results  Component Value Date   HGBA1C 4.9 02/27/2019       Assessment & Plan:  1. Acute non-recurrent pansinusitis Start Z-Pak.  Take as directed for 5 days. Rest and increase fluids. Continue using OTC medication to control symptoms.   - azithromycin (ZITHROMAX) 250 MG tablet; z-pack - take as directed for 5 days  Dispense: 6 tablet; Refill: 0  2. Acute cough May take Tessalon Perles up to 2 times daily as needed for acute cough.  Also recommend over-the-counter Delsym twice daily if needed. - benzonatate (TESSALON) 200 MG capsule; Take 1 capsule (200 mg total) by mouth 2 (two) times daily as needed for cough.  Dispense: 20 capsule; Refill: 0  3. Body mass index 29.0-29.9, adult Discussed lowering calorie intake to 1500 calories per day.  Patient is very active and exercises every day.  We will continue to monitor her weight closely.  Problem List Items Addressed This Visit       Respiratory   Acute non-recurrent pansinusitis - Primary   Relevant Medications   benzonatate (TESSALON) 200 MG capsule   azithromycin (ZITHROMAX) 250 MG tablet     Other   Body mass index 29.0-29.9, adult   Acute cough   Relevant Medications   benzonatate (TESSALON) 200 MG capsule     Meds ordered this encounter  Medications   benzonatate (TESSALON) 200 MG capsule    Sig: Take 1 capsule (200 mg total) by mouth 2 (two) times daily as needed for  cough.    Dispense:  20 capsule    Refill:  0    Order Specific Question:   Supervising Provider    Answer:   Nani Gasser D [[id number]]   azithromycin (ZITHROMAX) 250 MG tablet    Sig: z-pack - take as directed for 5 days    Dispense:  6 tablet    Refill:  0    Order Specific Question:   Supervising Provider    Answer:   Nani Gasser D [[id number]]     Carlean Jews, NP  This note was dictated using Dragon  Voice Recognition Software. Rapid proofreading was performed to expedite the delivery of the information. Despite proofreading, phonetic errors will occur which are common with this voice recognition software. Please take this into consideration. If there are any concerns, please contact our office.

## 2021-05-04 DIAGNOSIS — J0140 Acute pansinusitis, unspecified: Secondary | ICD-10-CM | POA: Insufficient documentation

## 2021-05-04 DIAGNOSIS — R051 Acute cough: Secondary | ICD-10-CM | POA: Insufficient documentation

## 2021-05-25 ENCOUNTER — Other Ambulatory Visit: Payer: Self-pay | Admitting: Nurse Practitioner

## 2021-05-25 ENCOUNTER — Telehealth: Payer: Self-pay | Admitting: Nurse Practitioner

## 2021-05-25 DIAGNOSIS — F988 Other specified behavioral and emotional disorders with onset usually occurring in childhood and adolescence: Secondary | ICD-10-CM

## 2021-05-25 MED ORDER — LISDEXAMFETAMINE DIMESYLATE 40 MG PO CAPS: 40.0000 mg | ORAL_CAPSULE | Freq: Every day | ORAL | 0 refills | Status: DC

## 2021-05-25 NOTE — Telephone Encounter (Signed)
Patient requesting a refill of her Vyvanse. Please advise. [phone]

## 2021-05-25 NOTE — Telephone Encounter (Signed)
Called pt she is advised of her Rx that was sent to the pharmacy and recommendation

## 2021-05-25 NOTE — Telephone Encounter (Signed)
Please let the patient know that I Sent new prescription for vyvanse 40mg  to [facility] for her. She will need to be seen for further refills. Thanks so much.   -HB

## 2021-05-25 NOTE — Progress Notes (Signed)
Sent new prescription for vyvanse 40mg  to [facility] for her. She will need to be seen for further refills.

## 2021-06-24 ENCOUNTER — Encounter: Payer: Self-pay | Admitting: Nurse Practitioner

## 2021-06-24 ENCOUNTER — Other Ambulatory Visit: Payer: Self-pay

## 2021-06-24 ENCOUNTER — Ambulatory Visit: Payer: Managed Care, Other (non HMO) | Admitting: Nurse Practitioner

## 2021-06-24 VITALS — BP 129/65 | HR 55 | Temp 97.8°F | Ht 62.0 in | Wt 159.8 lb

## 2021-06-24 DIAGNOSIS — F411 Generalized anxiety disorder: Secondary | ICD-10-CM

## 2021-06-24 DIAGNOSIS — Z6829 Body mass index (BMI) 29.0-29.9, adult: Secondary | ICD-10-CM | POA: Diagnosis not present

## 2021-06-24 DIAGNOSIS — F988 Other specified behavioral and emotional disorders with onset usually occurring in childhood and adolescence: Secondary | ICD-10-CM

## 2021-06-24 MED ORDER — SERTRALINE HCL 25 MG PO TABS: 25.0000 mg | ORAL_TABLET | Freq: Every day | ORAL | 1 refills | Status: DC

## 2021-06-24 MED ORDER — LISDEXAMFETAMINE DIMESYLATE 40 MG PO CAPS: 40.0000 mg | ORAL_CAPSULE | Freq: Every day | ORAL | 0 refills | Status: DC

## 2021-06-24 NOTE — Progress Notes (Unsigned)
Established patient visit   Patient: Jennifer Branch   DOB: [birth year]   56 y.o. Female  MRN: [id number] Visit Date: 06/24/2021   Chief Complaint  Patient presents with   ADHD   Subjective    HPI  ***   Medications: Outpatient Medications Prior to Visit  Medication Sig   Ascorbic Acid (VITAMIN C) 1000 MG tablet Vitamin C   Black Cohosh 40 MG CAPS Take by mouth.   Glucos-Chond-MSM-Bor-D3-Hyalur (MOVE FREE JOINT HEALTH ADV + D) TABS Take by mouth.   lisdexamfetamine (VYVANSE) 40 MG capsule Take 1 capsule (40 mg total) by mouth daily.   montelukast (SINGULAIR) 10 MG tablet Take 1 tablet (10 mg total) by mouth at bedtime.   Probiotic Product (PROBIOTIC DAILY PO) Take by mouth.   sertraline (ZOLOFT) 25 MG tablet Take 1 tablet (25 mg total) by mouth daily.   [DISCONTINUED] azithromycin (ZITHROMAX) 250 MG tablet z-pack - take as directed for 5 days   [DISCONTINUED] benzonatate (TESSALON) 200 MG capsule Take 1 capsule (200 mg total) by mouth 2 (two) times daily as needed for cough.   [DISCONTINUED] omeprazole (PRILOSEC) 10 MG capsule Take 1 capsule (10 mg total) by mouth daily.   Facility-Administered Medications Prior to Visit  Medication Dose Route Frequency Provider   betamethasone acetate-betamethasone sodium phosphate (CELESTONE) injection 3 mg  3 mg Intramuscular Once Evans, Brent M, DPM   betamethasone acetate-betamethasone sodium phosphate (CELESTONE) injection 3 mg  3 mg Intramuscular Once Felecia Shelling, DPM    Review of Systems  {Labs (Optional):23779}   Objective    BP 129/65    Pulse (!) 55    Temp 97.8 F (36.6 C)    Ht 5\' 2"  (1.575 m)    Wt 159 lb 12.8 oz (72.5 kg)    SpO2 100%    BMI 29.23 kg/m  BP Readings from Last 3 Encounters:  06/24/21 129/65  04/29/21 118/80  02/04/21 (!) 110/58    Wt Readings from Last 3 Encounters:  06/24/21 159 lb 12.8 oz (72.5 kg)  04/29/21 159 lb (72.1 kg)  02/04/21 161 lb 9.6 oz (73.3 kg)    Physical Exam  ***  No results  found for any visits on 06/24/21.  Assessment & Plan     Problem List Items Addressed This Visit   None    No follow-ups on file.         08/24/21, NP  [facility] at [facility] [phone] (phone) [phone] (fax)  [facility]

## 2021-08-06 ENCOUNTER — Telehealth: Payer: Self-pay | Admitting: Nurse Practitioner

## 2021-08-06 ENCOUNTER — Other Ambulatory Visit: Payer: Self-pay | Admitting: Nurse Practitioner

## 2021-08-06 DIAGNOSIS — F988 Other specified behavioral and emotional disorders with onset usually occurring in childhood and adolescence: Secondary | ICD-10-CM

## 2021-08-06 MED ORDER — LISDEXAMFETAMINE DIMESYLATE 40 MG PO CAPS: 40.0000 mg | ORAL_CAPSULE | Freq: Every day | ORAL | 0 refills | Status: DC

## 2021-08-06 NOTE — Telephone Encounter (Signed)
Patient requesting refill of her Vyvanse. Please advise.  ?

## 2021-08-06 NOTE — Telephone Encounter (Signed)
Called pt she is advised of her Rx that was sent 

## 2021-08-06 NOTE — Telephone Encounter (Signed)
Please let her know that I sent new prescription to [facility] [address] In [place]. Thanks so much.   -HB

## 2021-09-06 ENCOUNTER — Ambulatory Visit (INDEPENDENT_AMBULATORY_CARE_PROVIDER_SITE_OTHER): Payer: Managed Care, Other (non HMO) | Admitting: Nurse Practitioner

## 2021-09-06 ENCOUNTER — Encounter: Payer: Self-pay | Admitting: Nurse Practitioner

## 2021-09-06 VITALS — BP 127/67 | HR 68 | Temp 98.1°F | Ht 62.0 in | Wt 167.1 lb

## 2021-09-06 DIAGNOSIS — F988 Other specified behavioral and emotional disorders with onset usually occurring in childhood and adolescence: Secondary | ICD-10-CM

## 2021-09-06 DIAGNOSIS — N951 Menopausal and female climacteric states: Secondary | ICD-10-CM | POA: Diagnosis not present

## 2021-09-06 DIAGNOSIS — Z8379 Family history of other diseases of the digestive system: Secondary | ICD-10-CM

## 2021-09-06 DIAGNOSIS — F411 Generalized anxiety disorder: Secondary | ICD-10-CM

## 2021-09-06 DIAGNOSIS — Z1211 Encounter for screening for malignant neoplasm of colon: Secondary | ICD-10-CM | POA: Diagnosis not present

## 2021-09-06 LAB — POCT URINALYSIS DIP (CLINITEK)
Bilirubin, UA: NEGATIVE
Blood, UA: NEGATIVE
Glucose, UA: NEGATIVE mg/dL
Ketones, POC UA: NEGATIVE mg/dL
Leukocytes, UA: NEGATIVE
Nitrite, UA: NEGATIVE
POC PROTEIN,UA: NEGATIVE
Spec Grav, UA: 1.01 (ref 1.010–1.025)
Urobilinogen, UA: 0.2 E.U./dL
pH, UA: 6.5 (ref 5.0–8.0)

## 2021-09-06 MED ORDER — LISDEXAMFETAMINE DIMESYLATE 50 MG PO CAPS: 50.0000 mg | ORAL_CAPSULE | Freq: Every day | ORAL | 0 refills | Status: DC

## 2021-09-06 MED ORDER — SERTRALINE HCL 25 MG PO TABS: 37.5000 mg | ORAL_TABLET | Freq: Every day | ORAL | 1 refills | Status: DC

## 2021-09-06 NOTE — Progress Notes (Signed)
Established patient visit   Patient: Jennifer Branch   DOB: 10-Mar-1966   56 y.o. Female  MRN: [id number] Visit Date: 09/06/2021   Chief Complaint  Patient presents with   Menopause   Menstrual Problem   Subjective    HPI  Presents for follow up visit.  -doing well with low dose sertraline.  -taking vyvanse daily when needed to help with focus and concentration.  -PDMP profile reviewed today  --overdose risk score is 190. --last fill vyvanse was 08/06/2021.   -patient has been having intermittent menstrual like cramping, having diarrhea. Having hot flashes. Happening more frequently and getting worse.   -would like to increase her anxiety medication. Getting busier at work. Doing online course in nutrition. Feeling overwhelmed and getting terrible anxiety, especially about school courses.   Medications: Outpatient Medications Prior to Visit  Medication Sig   Ascorbic Acid (VITAMIN C) 1000 MG tablet Vitamin C   Black Cohosh 40 MG CAPS Take by mouth.   Glucos-Chond-MSM-Bor-D3-Hyalur (MOVE FREE JOINT HEALTH ADV + D) TABS Take by mouth.   Magnesium 100 MG CAPS    Probiotic Product (PROBIOTIC DAILY PO) Take by mouth.   [DISCONTINUED] lisdexamfetamine (VYVANSE) 40 MG capsule Take 1 capsule (40 mg total) by mouth daily.   [DISCONTINUED] sertraline (ZOLOFT) 25 MG tablet Take 1 tablet (25 mg total) by mouth daily.   [DISCONTINUED] montelukast (SINGULAIR) 10 MG tablet Take 1 tablet (10 mg total) by mouth at bedtime.   Facility-Administered Medications Prior to Visit  Medication Dose Route Frequency Provider   betamethasone acetate-betamethasone sodium phosphate (CELESTONE) injection 3 mg  3 mg Intramuscular Once Gala LewandowskyEvans, Brent M, DPM   betamethasone acetate-betamethasone sodium phosphate (CELESTONE) injection 3 mg  3 mg Intramuscular Once Felecia ShellingEvans, Brent M, DPM    Review of Systems  Constitutional:  Negative for activity change, appetite change, chills, fatigue and fever.       Weight gain  8 pounds since her most recent visit   HENT:  Negative for congestion, postnasal drip, rhinorrhea, sinus pressure, sinus pain, sneezing and sore throat.   Eyes: Negative.   Respiratory:  Negative for cough, chest tightness, shortness of breath and wheezing.   Cardiovascular:  Negative for chest pain and palpitations.  Gastrointestinal:  Negative for abdominal pain, constipation, diarrhea, nausea and vomiting.  Endocrine: Negative for cold intolerance, heat intolerance, polydipsia and polyuria.  Genitourinary:  Positive for menstrual problem. Negative for dyspareunia, dysuria, flank pain, frequency and urgency.  Musculoskeletal:  Negative for arthralgias, back pain and myalgias.  Skin:  Negative for rash.  Allergic/Immunologic: Negative for environmental allergies.  Neurological:  Negative for dizziness, weakness and headaches.  Hematological:  Negative for adenopathy.  Psychiatric/Behavioral:  Positive for decreased concentration and dysphoric mood. The patient is nervous/anxious.       Objective     Today's Vitals   09/06/21 1046  BP: 127/67  Pulse: 68  Temp: 98.1 F (36.7 C)  SpO2: 100%  Weight: 167 lb 1.9 oz (75.8 kg)  Height: 5\' 2"  (1.575 m)   Body mass index is 30.57 kg/m.   BP Readings from Last 3 Encounters:  09/06/21 127/67  06/24/21 129/65  04/29/21 118/80    Wt Readings from Last 3 Encounters:  09/06/21 167 lb 1.9 oz (75.8 kg)  06/24/21 159 lb 12.8 oz (72.5 kg)  04/29/21 159 lb (72.1 kg)    Physical Exam Vitals and nursing note reviewed.  Constitutional:      Appearance: Normal appearance. She is well-developed.  HENT:  Head: Normocephalic and atraumatic.     Nose: Nose normal.     Mouth/Throat:     Mouth: Mucous membranes are moist.     Pharynx: Oropharynx is clear.  Eyes:     Extraocular Movements: Extraocular movements intact.     Conjunctiva/sclera: Conjunctivae normal.     Pupils: Pupils are equal, round, and reactive to light.   Cardiovascular:     Rate and Rhythm: Normal rate and regular rhythm.     Pulses: Normal pulses.     Heart sounds: Normal heart sounds.  Pulmonary:     Effort: Pulmonary effort is normal.     Breath sounds: Normal breath sounds.  Abdominal:     Palpations: Abdomen is soft.  Musculoskeletal:        General: Normal range of motion.     Cervical back: Normal range of motion and neck supple.  Lymphadenopathy:     Cervical: No cervical adenopathy.  Skin:    General: Skin is warm and dry.     Capillary Refill: Capillary refill takes less than 2 seconds.  Neurological:     General: No focal deficit present.     Mental Status: She is alert and oriented to person, place, and time.  Psychiatric:        Attention and Perception: Attention and perception normal.        Mood and Affect: Affect normal. Mood is anxious.        Speech: Speech normal.        Behavior: Behavior normal. Behavior is cooperative.        Thought Content: Thought content normal.        Cognition and Memory: Cognition and memory normal.        Judgment: Judgment normal.      Results for orders placed or performed in visit on 09/06/21  Estradiol  Result Value Ref Range   Estradiol <5.0 pg/mL  FSH/LH  Result Value Ref Range   LH 36.8 mIU/mL   FSH 56.2 mIU/mL  T4, free  Result Value Ref Range   Free T4 1.29 0.82 - 1.77 ng/dL  TSH  Result Value Ref Range   TSH 0.966 0.450 - 4.500 uIU/mL  POCT URINALYSIS DIP (CLINITEK)  Result Value Ref Range   Color, UA yellow yellow   Clarity, UA clear clear   Glucose, UA negative negative mg/dL   Bilirubin, UA negative negative   Ketones, POC UA negative negative mg/dL   Spec Grav, UA 5.397 6.734 - 1.025   Blood, UA negative negative   pH, UA 6.5 5.0 - 8.0   POC PROTEIN,UA negative negative, trace   Urobilinogen, UA 0.2 0.2 or 1.0 E.U./dL   Nitrite, UA Negative Negative   Leukocytes, UA Negative Negative    Assessment & Plan    1. Perimenopausal Patient with  intermittent menstrual cramping without menstrual bleeding.  We will check thyroid panel along with estradiol, FSH, and LH. - TSH; Future - T4, free; Future - FSH/LH; Future - Estradiol; Future - Estradiol - FSH/LH - T4, free - TSH - POCT URINALYSIS DIP (CLINITEK)  2. Generalized anxiety disorder Small increase in sertraline to 37.5 mg daily.  We will reassess in 4 weeks. - sertraline (ZOLOFT) 25 MG tablet; Take 1.5 tablets (37.5 mg total) by mouth daily.  Dispense: 135 tablet; Refill: 1  3. Adult attention deficit disorder Patient doing well with current dose of Vyvanse 50 mg.  May continue to take daily as needed.  Patient understands  to take medication holidays on weekends and days off. - lisdexamfetamine (VYVANSE) 50 MG capsule; Take 1 capsule (50 mg total) by mouth daily.  Dispense: 30 capsule; Refill: 0  4. Screening for colon cancer Refer to GI for screening colonoscopy. - Ambulatory referral to Gastroenterology  5. Family history of ulcerative colitis Refer to GI for screening colonoscopy. - Ambulatory referral to Gastroenterology    Problem List Items Addressed This Visit       Other   Screening for colon cancer   Relevant Orders   Ambulatory referral to Gastroenterology   Adult attention deficit disorder   Relevant Medications   lisdexamfetamine (VYVANSE) 50 MG capsule   Generalized anxiety disorder   Relevant Medications   sertraline (ZOLOFT) 25 MG tablet   Perimenopausal - Primary   Relevant Orders   TSH (Completed)   T4, free (Completed)   FSH/LH (Completed)   Estradiol (Completed)   POCT URINALYSIS DIP (CLINITEK) (Completed)   Other Visit Diagnoses     Family history of ulcerative colitis       Relevant Orders   Ambulatory referral to Gastroenterology        Return in about 4 weeks (around 10/04/2021) for mood - increased dose sertralin and vyvanse .         Carlean Jews, NP  [facility] at [facility] [phone]  (phone) [phone] (fax)  [facility]

## 2021-09-07 ENCOUNTER — Encounter: Payer: Self-pay | Admitting: Nurse Practitioner

## 2021-09-07 LAB — ESTRADIOL: Estradiol: <5 pg/mL

## 2021-09-07 LAB — TSH: TSH: 0.966 u[IU]/mL (ref 0.450–4.500)

## 2021-09-07 LAB — FSH/LH
FSH: 56.2 m[IU]/mL
LH: 36.8 m[IU]/mL

## 2021-09-07 LAB — T4, FREE: Free T4: 1.29 ng/dL (ref 0.82–1.77)

## 2021-09-13 DIAGNOSIS — N951 Menopausal and female climacteric states: Secondary | ICD-10-CM | POA: Insufficient documentation

## 2021-09-15 ENCOUNTER — Other Ambulatory Visit: Payer: Self-pay

## 2021-09-15 ENCOUNTER — Telehealth: Payer: Self-pay

## 2021-09-15 DIAGNOSIS — Z1211 Encounter for screening for malignant neoplasm of colon: Secondary | ICD-10-CM

## 2021-09-15 DIAGNOSIS — Z8379 Family history of other diseases of the digestive system: Secondary | ICD-10-CM

## 2021-09-15 MED ORDER — NA SULFATE-K SULFATE-MG SULF 17.5-3.13-1.6 GM/177ML PO SOLN: 1.0000 | Freq: Once | ORAL | 0 refills | Status: AC

## 2021-09-15 NOTE — Telephone Encounter (Signed)
Gastroenterology Pre-Procedure Review  Request Date: 10/11/21 Requesting Physician: Dr. Allen Norris  PATIENT REVIEW QUESTIONS: The patient responded to the following health history questions as indicated:    1. Are you having any GI issues? no father ulcerative colitis 2. Do you have a personal history of Polyps? no 3. Do you have a family history of Colon Cancer or Polyps? yes (father polyps) 4. Diabetes Mellitus? no 5. Joint replacements in the past 12 months?no 6. Major health problems in the past 3 months?no 7. Any artificial heart valves, MVP, or defibrillator?no    MEDICATIONS & ALLERGIES:    Patient reports the following regarding taking any anticoagulation/antiplatelet therapy:   Plavix, Coumadin, Eliquis, Xarelto, Lovenox, Pradaxa, Brilinta, or Effient? no Aspirin? no  Patient confirms/reports the following medications:  Current Outpatient Medications  Medication Sig Dispense Refill   Ascorbic Acid (VITAMIN C) 1000 MG tablet Vitamin C     Black Cohosh 40 MG CAPS Take by mouth.     Glucos-Chond-MSM-Bor-D3-Hyalur (MOVE FREE JOINT HEALTH ADV + D) TABS Take by mouth.     lisdexamfetamine (VYVANSE) 50 MG capsule Take 1 capsule (50 mg total) by mouth daily. 30 capsule 0   Magnesium 100 MG CAPS      Probiotic Product (PROBIOTIC DAILY PO) Take by mouth.     sertraline (ZOLOFT) 25 MG tablet Take 1.5 tablets (37.5 mg total) by mouth daily. 135 tablet 1   Current Facility-Administered Medications  Medication Dose Route Frequency Provider Last Rate Last Admin   betamethasone acetate-betamethasone sodium phosphate (CELESTONE) injection 3 mg  3 mg Intramuscular Once Daylene Katayama M, DPM       betamethasone acetate-betamethasone sodium phosphate (CELESTONE) injection 3 mg  3 mg Intramuscular Once Edrick Kins, DPM        Patient confirms/reports the following allergies:  Allergies  Allergen Reactions   Crab [Shellfish Allergy] Swelling and Other (See Comments)    Crab only no issues with  any other shell fish happened when a child not certain of details   Penicillins     Does not recall reaction happened when she was a child     No orders of the defined types were placed in this encounter.   AUTHORIZATION INFORMATION Primary Insurance: 1D#: Group #:  Secondary Insurance: 1D#: Group #:  SCHEDULE INFORMATION: Date: 10/08/21 Time: Location: [place]

## 2021-09-21 ENCOUNTER — Ambulatory Visit: Payer: Managed Care, Other (non HMO) | Admitting: Nurse Practitioner

## 2021-10-01 ENCOUNTER — Encounter: Payer: Self-pay | Admitting: Gastroenterology

## 2021-10-01 NOTE — Anesthesia Preprocedure Evaluation (Addendum)
Anesthesia Evaluation  Patient identified by MRN, date of birth, ID band Patient awake    Reviewed: Allergy & Precautions, NPO status , Patient's Chart, lab work & pertinent test results, reviewed documented beta blocker date and time   History of Anesthesia Complications (+) PONV and history of anesthetic complications  Airway Mallampati: III  TM Distance: >3 FB Neck ROM: Full    Dental   Pulmonary former smoker,    breath sounds clear to auscultation       Cardiovascular (-) angina(-) DOE  Rhythm:Regular Rate:Normal     Neuro/Psych PSYCHIATRIC DISORDERS (ADHD) Anxiety Depression    GI/Hepatic GERD  ,  Endo/Other    Renal/GU      Musculoskeletal   Abdominal (+) + obese (BMI 30),   Peds  Hematology   Anesthesia Other Findings   Reproductive/Obstetrics                            Anesthesia Physical Anesthesia Plan  ASA: 2  Anesthesia Plan: General   Post-op Pain Management:    Induction: Intravenous  PONV Risk Score and Plan: 4 or greater and Propofol infusion, TIVA, Treatment may vary due to age or medical condition, Ondansetron and Dexamethasone  Airway Management Planned: Natural Airway and Nasal Cannula  Additional Equipment:   Intra-op Plan:   Post-operative Plan:   Informed Consent: I have reviewed the patients History and Physical, chart, labs and discussed the procedure including the risks, benefits and alternatives for the proposed anesthesia with the patient or authorized representative who has indicated his/her understanding and acceptance.       Plan Discussed with: CRNA and Anesthesiologist  Anesthesia Plan Comments:         Anesthesia Quick Evaluation

## 2021-10-03 NOTE — Progress Notes (Deleted)
Established patient visit   Patient: Jennifer Branch   DOB: 02/22/1966   56 y.o. Female  MRN: [id number] Visit Date: 10/04/2021   No chief complaint on file.  Subjective    HPI  Follow up visit.  -increased dose sertraline and vyvanse -PDMP profile done 10/03/2021 - overdose risk score 190.  -needs to have controlled substances agreement completed and signed.  -perimenopausal.  -recent colonoscopy.      Medications: Outpatient Medications Prior to Visit  Medication Sig   Ascorbic Acid (VITAMIN C) 1000 MG tablet Vitamin C   Black Cohosh 40 MG CAPS Take by mouth.   Glucos-Chond-MSM-Bor-D3-Hyalur (MOVE FREE JOINT HEALTH ADV + D) TABS Take by mouth.   lisdexamfetamine (VYVANSE) 50 MG capsule Take 1 capsule (50 mg total) by mouth daily.   Magnesium 100 MG CAPS    Probiotic Product (PROBIOTIC DAILY PO) Take by mouth.   sertraline (ZOLOFT) 25 MG tablet Take 1.5 tablets (37.5 mg total) by mouth daily.   Facility-Administered Medications Prior to Visit  Medication Dose Route Frequency Provider   betamethasone acetate-betamethasone sodium phosphate (CELESTONE) injection 3 mg  3 mg Intramuscular Once Gala Lewandowsky M, DPM   betamethasone acetate-betamethasone sodium phosphate (CELESTONE) injection 3 mg  3 mg Intramuscular Once Felecia Shelling, DPM    Review of Systems  {Labs (Optional):23779}   Objective    There were no vitals taken for this visit. BP Readings from Last 3 Encounters:  09/06/21 127/67  06/24/21 129/65  04/29/21 118/80    Wt Readings from Last 3 Encounters:  09/06/21 167 lb 1.9 oz (75.8 kg)  06/24/21 159 lb 12.8 oz (72.5 kg)  04/29/21 159 lb (72.1 kg)    Physical Exam  ***  No results found for any visits on 10/04/21.  Assessment & Plan     Problem List Items Addressed This Visit   None    No follow-ups on file.         Carlean Jews, NP  [facility] Care at [facility] [phone] (phone) [phone] (fax)  [facility]

## 2021-10-04 ENCOUNTER — Ambulatory Visit: Payer: Managed Care, Other (non HMO) | Admitting: Nurse Practitioner

## 2021-10-08 ENCOUNTER — Telehealth: Payer: Self-pay | Admitting: Nurse Practitioner

## 2021-10-08 ENCOUNTER — Other Ambulatory Visit: Payer: Self-pay | Admitting: Nurse Practitioner

## 2021-10-08 DIAGNOSIS — F988 Other specified behavioral and emotional disorders with onset usually occurring in childhood and adolescence: Secondary | ICD-10-CM

## 2021-10-11 ENCOUNTER — Other Ambulatory Visit: Payer: Self-pay

## 2021-10-11 ENCOUNTER — Ambulatory Visit: Payer: Managed Care, Other (non HMO) | Admitting: Anesthesiology

## 2021-10-11 ENCOUNTER — Encounter
Admission: RE | Disposition: A | Discharge status: Home / Self Care | Payer: Self-pay | Source: Home / Self Care | Attending: Gastroenterology

## 2021-10-11 ENCOUNTER — Ambulatory Visit
Admission: RE | Admit: 2021-10-11 | Discharge: 2021-10-11 | Disposition: A | Discharge status: Home / Self Care | Payer: Managed Care, Other (non HMO) | Attending: Gastroenterology | Admitting: Gastroenterology

## 2021-10-11 DIAGNOSIS — E669 Obesity, unspecified: Secondary | ICD-10-CM | POA: Insufficient documentation

## 2021-10-11 DIAGNOSIS — Z1211 Encounter for screening for malignant neoplasm of colon: Secondary | ICD-10-CM

## 2021-10-11 DIAGNOSIS — F909 Attention-deficit hyperactivity disorder, unspecified type: Secondary | ICD-10-CM | POA: Diagnosis not present

## 2021-10-11 DIAGNOSIS — Z87891 Personal history of nicotine dependence: Secondary | ICD-10-CM | POA: Diagnosis not present

## 2021-10-11 DIAGNOSIS — K641 Second degree hemorrhoids: Secondary | ICD-10-CM | POA: Diagnosis not present

## 2021-10-11 DIAGNOSIS — F418 Other specified anxiety disorders: Secondary | ICD-10-CM | POA: Diagnosis not present

## 2021-10-11 DIAGNOSIS — Z8379 Family history of other diseases of the digestive system: Secondary | ICD-10-CM

## 2021-10-11 DIAGNOSIS — N951 Menopausal and female climacteric states: Secondary | ICD-10-CM

## 2021-10-11 DIAGNOSIS — Z6829 Body mass index (BMI) 29.0-29.9, adult: Secondary | ICD-10-CM | POA: Insufficient documentation

## 2021-10-11 HISTORY — DX: Presence of spectacles and contact lenses: Z97.3

## 2021-10-11 HISTORY — PX: COLONOSCOPY WITH PROPOFOL: SHX5780

## 2021-10-11 HISTORY — DX: Attention-deficit hyperactivity disorder, unspecified type: F90.9

## 2021-10-11 HISTORY — DX: Nausea with vomiting, unspecified: R11.2

## 2021-10-11 HISTORY — DX: Nausea with vomiting, unspecified: Z98.890

## 2021-10-11 SURGERY — COLONOSCOPY WITH PROPOFOL
Anesthesia: General | Site: Rectum

## 2021-10-11 MED ORDER — LACTATED RINGERS IV SOLN: INTRAVENOUS | Status: DC

## 2021-10-11 MED ORDER — LIDOCAINE HCL (CARDIAC) PF 100 MG/5ML IV SOSY
PREFILLED_SYRINGE | INTRAVENOUS | Status: DC | PRN
  Administered 2021-10-11: 30 mg via INTRAVENOUS

## 2021-10-11 MED ORDER — STERILE WATER FOR IRRIGATION IR SOLN
Status: DC | PRN
  Administered 2021-10-11: 1

## 2021-10-11 MED ORDER — PROPOFOL 10 MG/ML IV BOLUS
INTRAVENOUS | Status: DC | PRN
  Administered 2021-10-11 (×2): 40 mg via INTRAVENOUS
  Administered 2021-10-11: 150 mg via INTRAVENOUS
  Administered 2021-10-11: 40 mg via INTRAVENOUS
  Administered 2021-10-11: 20 mg via INTRAVENOUS

## 2021-10-11 MED ORDER — SODIUM CHLORIDE 0.9 % IV SOLN: INTRAVENOUS | Status: DC

## 2021-10-11 MED ORDER — LISDEXAMFETAMINE DIMESYLATE 50 MG PO CAPS: 50.0000 mg | ORAL_CAPSULE | Freq: Every day | ORAL | 0 refills | Status: DC

## 2021-10-11 SURGICAL SUPPLY — 22 items
CLIP HMST 235XBRD CATH ROT (MISCELLANEOUS) IMPLANT
CLIP RESOLUTION 360 11X235 (MISCELLANEOUS)
ELECT REM PT RETURN 9FT ADLT (ELECTROSURGICAL)
ELECTRODE REM PT RTRN 9FT ADLT (ELECTROSURGICAL) IMPLANT
FORCEPS BIOP RAD 4 LRG CAP 4 (CUTTING FORCEPS) IMPLANT
GOWN CVR UNV OPN BCK APRN NK (MISCELLANEOUS) ×2 IMPLANT
GOWN ISOL THUMB LOOP REG UNIV (MISCELLANEOUS) ×2
INJECTOR VARIJECT VIN23 (MISCELLANEOUS) IMPLANT
KIT DEFENDO VALVE AND CONN (KITS) IMPLANT
KIT PRC NS LF DISP ENDO (KITS) ×1 IMPLANT
KIT PROCEDURE OLYMPUS (KITS) ×1
MANIFOLD NEPTUNE II (INSTRUMENTS) ×2 IMPLANT
MARKER SPOT ENDO TATTOO 5ML (MISCELLANEOUS) IMPLANT
PROBE APC STR FIRE (PROBE) IMPLANT
RETRIEVER NET ROTH 2.5X230 LF (MISCELLANEOUS) IMPLANT
SNARE COLD EXACTO (MISCELLANEOUS) IMPLANT
SNARE SHORT THROW 13M SML OVAL (MISCELLANEOUS) IMPLANT
SNARE SNG USE RND 15MM (INSTRUMENTS) IMPLANT
SPOT EX ENDOSCOPIC TATTOO (MISCELLANEOUS)
TRAP ETRAP POLY (MISCELLANEOUS) IMPLANT
VARIJECT INJECTOR VIN23 (MISCELLANEOUS)
WATER STERILE IRR 250ML POUR (IV SOLUTION) ×2 IMPLANT

## 2021-10-12 ENCOUNTER — Encounter: Payer: Self-pay | Admitting: Gastroenterology

## 2021-10-17 ENCOUNTER — Encounter: Payer: Self-pay | Admitting: Nurse Practitioner

## 2021-10-18 ENCOUNTER — Ambulatory Visit (INDEPENDENT_AMBULATORY_CARE_PROVIDER_SITE_OTHER): Payer: Managed Care, Other (non HMO) | Admitting: Nurse Practitioner

## 2021-10-18 ENCOUNTER — Encounter: Payer: Self-pay | Admitting: Nurse Practitioner

## 2021-10-18 VITALS — BP 132/71 | HR 71 | Ht 62.0 in | Wt 174.4 lb

## 2021-10-18 DIAGNOSIS — R300 Dysuria: Secondary | ICD-10-CM

## 2021-10-18 DIAGNOSIS — R319 Hematuria, unspecified: Secondary | ICD-10-CM

## 2021-10-18 DIAGNOSIS — N390 Urinary tract infection, site not specified: Secondary | ICD-10-CM

## 2021-10-18 LAB — POCT URINALYSIS DIP (CLINITEK)
Bilirubin, UA: NEGATIVE
Glucose, UA: NEGATIVE mg/dL
Ketones, POC UA: NEGATIVE mg/dL
Nitrite, UA: NEGATIVE
POC PROTEIN,UA: NEGATIVE
Spec Grav, UA: 1.01 (ref 1.010–1.025)
Urobilinogen, UA: 0.2 E.U./dL
pH, UA: 7 (ref 5.0–8.0)

## 2021-10-18 MED ORDER — SULFAMETHOXAZOLE-TRIMETHOPRIM 800-160 MG PO TABS: 1.0000 | ORAL_TABLET | Freq: Two times a day (BID) | ORAL | 0 refills | Status: DC

## 2021-10-18 NOTE — Progress Notes (Signed)
Established patient visit   Patient: Jennifer Branch   DOB: 08-03-1965   56 y.o. Female  MRN: [id number] Visit Date: 10/18/2021   Chief Complaint  Patient presents with   Urinary Tract Infection   Subjective    Urinary Tract Infection  Associated symptoms include flank pain, frequency, hematuria and urgency. Pertinent negatives include no chills, nausea or vomiting.    Patient here for evaluation of UTI.  -started having urinary frequency on Friday.  -did try OTC Azo. Did help a little.  -Had trouble sleeping last night due to discomfort and frequency.  -having dysuria with every urination -denies nausea and vomiting.    Medications: Outpatient Medications Prior to Visit  Medication Sig   Ascorbic Acid (VITAMIN C) 1000 MG tablet Vitamin C   Black Cohosh 40 MG CAPS Take by mouth.   Glucos-Chond-MSM-Bor-D3-Hyalur (MOVE FREE JOINT HEALTH ADV + D) TABS Take by mouth.   lisdexamfetamine (VYVANSE) 50 MG capsule Take 1 capsule (50 mg total) by mouth daily.   Probiotic Product (PROBIOTIC DAILY PO) Take by mouth.   sertraline (ZOLOFT) 25 MG tablet Take 1.5 tablets (37.5 mg total) by mouth daily.   [DISCONTINUED] Magnesium 100 MG CAPS    Facility-Administered Medications Prior to Visit  Medication Dose Route Frequency Provider   betamethasone acetate-betamethasone sodium phosphate (CELESTONE) injection 3 mg  3 mg Intramuscular Once Gala Lewandowsky M, DPM   betamethasone acetate-betamethasone sodium phosphate (CELESTONE) injection 3 mg  3 mg Intramuscular Once Felecia Shelling, DPM    Review of Systems  Constitutional:  Negative for activity change, appetite change, chills, fatigue and fever.  HENT:  Negative for congestion, postnasal drip, rhinorrhea, sinus pressure, sinus pain, sneezing and sore throat.   Eyes: Negative.   Respiratory:  Negative for cough, chest tightness, shortness of breath and wheezing.   Cardiovascular:  Negative for chest pain and palpitations.  Gastrointestinal:   Negative for abdominal pain, constipation, diarrhea, nausea and vomiting.  Endocrine: Negative for cold intolerance, heat intolerance, polydipsia and polyuria.  Genitourinary:  Positive for dysuria, flank pain, frequency, hematuria and urgency. Negative for dyspareunia.  Musculoskeletal:  Negative for arthralgias, back pain and myalgias.  Skin:  Negative for rash.  Allergic/Immunologic: Negative for environmental allergies.  Neurological:  Negative for dizziness, weakness and headaches.  Hematological:  Negative for adenopathy.  Psychiatric/Behavioral:  The patient is not nervous/anxious.       Objective     Today's Vitals   10/18/21 1435  BP: 132/71  Pulse: 71  SpO2: 98%  Weight: 174 lb 6.4 oz (79.1 kg)   Body mass index is 31.9 kg/m.   BP Readings from Last 3 Encounters:  10/18/21 132/71  10/11/21 (!) 119/56  09/06/21 127/67    Wt Readings from Last 3 Encounters:  10/18/21 174 lb 6.4 oz (79.1 kg)  10/11/21 163 lb 3.2 oz (74 kg)  09/06/21 167 lb 1.9 oz (75.8 kg)    Physical Exam Vitals and nursing note reviewed.  Constitutional:      Appearance: Normal appearance. She is well-developed.  HENT:     Head: Normocephalic and atraumatic.  Eyes:     Pupils: Pupils are equal, round, and reactive to light.  Cardiovascular:     Rate and Rhythm: Normal rate and regular rhythm.     Pulses: Normal pulses.     Heart sounds: Normal heart sounds.  Pulmonary:     Effort: Pulmonary effort is normal.     Breath sounds: Normal breath sounds.  Abdominal:  Palpations: Abdomen is soft.  Genitourinary:    Comments: Urine sample positive for moderate WBC and moderate blood. Musculoskeletal:        General: Normal range of motion.     Cervical back: Normal range of motion and neck supple.  Lymphadenopathy:     Cervical: No cervical adenopathy.  Skin:    General: Skin is warm and dry.     Capillary Refill: Capillary refill takes less than 2 seconds.  Neurological:      General: No focal deficit present.     Mental Status: She is alert and oriented to person, place, and time.  Psychiatric:        Mood and Affect: Mood normal.        Behavior: Behavior normal.        Thought Content: Thought content normal.        Judgment: Judgment normal.     Results for orders placed or performed in visit on 10/18/21  POCT URINALYSIS DIP (CLINITEK)  Result Value Ref Range   Color, UA yellow yellow   Clarity, UA clear clear   Glucose, UA negative negative mg/dL   Bilirubin, UA negative negative   Ketones, POC UA negative negative mg/dL   Spec Grav, UA 3.846 6.599 - 1.025   Blood, UA moderate (A) negative   pH, UA 7.0 5.0 - 8.0   POC PROTEIN,UA negative negative, trace   Urobilinogen, UA 0.2 0.2 or 1.0 E.U./dL   Nitrite, UA Negative Negative   Leukocytes, UA Moderate (2+) (A) Negative    Assessment & Plan    1. Urinary tract infection with hematuria, site unspecified Start bactrim DS twice daily for 7 days. Send urine for culture and sensitivity and adjust antibiotics as indicated  - POCT URINALYSIS DIP (CLINITEK) - Urine Culture; Future - Urine Culture - sulfamethoxazole-trimethoprim (BACTRIM DS) 800-160 MG tablet; Take 1 tablet by mouth 2 (two) times daily.  Dispense: 14 tablet; Refill: 0  2. Dysuria Recommend continued use of AZO as needed and as indicated to improve dysuria    Problem List Items Addressed This Visit       Genitourinary   Urinary tract infection with hematuria - Primary   Relevant Medications   sulfamethoxazole-trimethoprim (BACTRIM DS) 800-160 MG tablet   Other Relevant Orders   POCT URINALYSIS DIP (CLINITEK) (Completed)   Urine Culture     Other   Dysuria     Return for prn worsening or persistent symptoms.         Carlean Jews, NP  [facility] Primary Care at [facility] [phone] (phone) [phone] (fax)  [facility]

## 2021-10-18 NOTE — Telephone Encounter (Signed)
Patient has appointment later today.

## 2021-10-20 ENCOUNTER — Encounter: Payer: Self-pay | Admitting: Nurse Practitioner

## 2021-10-20 LAB — URINE CULTURE

## 2021-10-20 NOTE — Progress Notes (Signed)
Infection with e. Coli. Patient started on bactrim DS at time of visit. Waiting on culture and sensitivity results

## 2022-01-03 ENCOUNTER — Other Ambulatory Visit: Payer: Self-pay | Admitting: Nurse Practitioner

## 2022-01-03 DIAGNOSIS — F988 Other specified behavioral and emotional disorders with onset usually occurring in childhood and adolescence: Secondary | ICD-10-CM

## 2022-01-04 ENCOUNTER — Other Ambulatory Visit: Payer: Self-pay | Admitting: Nurse Practitioner

## 2022-01-04 DIAGNOSIS — F988 Other specified behavioral and emotional disorders with onset usually occurring in childhood and adolescence: Secondary | ICD-10-CM

## 2022-01-06 ENCOUNTER — Telehealth: Payer: Self-pay | Admitting: Nurse Practitioner

## 2022-01-06 ENCOUNTER — Other Ambulatory Visit: Payer: Self-pay | Admitting: Nurse Practitioner

## 2022-01-06 DIAGNOSIS — F988 Other specified behavioral and emotional disorders with onset usually occurring in childhood and adolescence: Secondary | ICD-10-CM

## 2022-01-06 MED ORDER — LISDEXAMFETAMINE DIMESYLATE 50 MG PO CAPS: 50.0000 mg | ORAL_CAPSULE | Freq: Every day | ORAL | 0 refills | Status: DC

## 2022-01-06 NOTE — Telephone Encounter (Signed)
Patient requesting refill of Vyvanse. Please advise.

## 2022-01-06 NOTE — Telephone Encounter (Signed)
Please let her know that I sent new prescription for this. Sent to [facility] on [address] in [place].

## 2022-01-18 ENCOUNTER — Ambulatory Visit: Payer: Managed Care, Other (non HMO) | Admitting: Gastroenterology

## 2022-02-08 ENCOUNTER — Telehealth: Payer: Self-pay

## 2022-02-08 ENCOUNTER — Ambulatory Visit: Payer: Managed Care, Other (non HMO) | Admitting: Nurse Practitioner

## 2022-02-08 ENCOUNTER — Other Ambulatory Visit: Payer: Self-pay | Admitting: Nurse Practitioner

## 2022-02-08 ENCOUNTER — Encounter: Payer: Self-pay | Admitting: Nurse Practitioner

## 2022-02-08 VITALS — BP 140/78 | HR 65 | Ht 62.0 in | Wt 180.8 lb

## 2022-02-08 DIAGNOSIS — R5383 Other fatigue: Secondary | ICD-10-CM

## 2022-02-08 DIAGNOSIS — F988 Other specified behavioral and emotional disorders with onset usually occurring in childhood and adolescence: Secondary | ICD-10-CM

## 2022-02-08 DIAGNOSIS — R1013 Epigastric pain: Secondary | ICD-10-CM | POA: Diagnosis not present

## 2022-02-08 DIAGNOSIS — Z23 Encounter for immunization: Secondary | ICD-10-CM | POA: Diagnosis not present

## 2022-02-08 MED ORDER — LISDEXAMFETAMINE DIMESYLATE 50 MG PO CAPS: 50.0000 mg | ORAL_CAPSULE | Freq: Every day | ORAL | 0 refills | Status: DC

## 2022-02-08 NOTE — Telephone Encounter (Signed)
Pt is requesting a refill on: lisdexamfetamine (VYVANSE) 50 MG capsule  Pharmacy: [id number] [address], [place] AT [address] 10/18/21 ROV 02/08/22

## 2022-02-08 NOTE — Telephone Encounter (Signed)
Called pt she is advised of her Rx 

## 2022-02-08 NOTE — Progress Notes (Signed)
Refilled vyvanse for 30 days. Will need to reschedule appointment for additional fills.

## 2022-02-08 NOTE — Progress Notes (Signed)
Established patient visit   Patient: Jennifer Branch   DOB: 1965/09/02   56 y.o. Female  MRN: [id number] Visit Date: 02/08/2022   Chief Complaint  Patient presents with   Medication Refill   Subjective    HPI  Epigastric pain  - intermittent  -pain is sharp. Feels bloated -has been diagnosed with gastritis in the past  -restarted omeprazole daily. Helped a little, but pain came back this morning.   Refilled vyvanse today -weaned herself off of sertraline over last few months  -doing well .   Medications: Outpatient Medications Prior to Visit  Medication Sig   Ascorbic Acid (VITAMIN C) 1000 MG tablet Vitamin C   Black Cohosh 40 MG CAPS Take by mouth.   Glucos-Chond-MSM-Bor-D3-Hyalur (MOVE FREE JOINT HEALTH ADV + D) TABS Take by mouth.   lisdexamfetamine (VYVANSE) 50 MG capsule Take 1 capsule (50 mg total) by mouth daily.   Probiotic Product (PROBIOTIC DAILY PO) Take by mouth.   sertraline (ZOLOFT) 25 MG tablet Take 1.5 tablets (37.5 mg total) by mouth daily.   [DISCONTINUED] sulfamethoxazole-trimethoprim (BACTRIM DS) 800-160 MG tablet Take 1 tablet by mouth 2 (two) times daily.   Facility-Administered Medications Prior to Visit  Medication Dose Route Frequency Provider   betamethasone acetate-betamethasone sodium phosphate (CELESTONE) injection 3 mg  3 mg Intramuscular Once Daylene Katayama M, DPM   betamethasone acetate-betamethasone sodium phosphate (CELESTONE) injection 3 mg  3 mg Intramuscular Once Edrick Kins, DPM    Review of Systems  Constitutional:  Negative for activity change, appetite change, chills, fatigue and fever.  HENT:  Negative for congestion, postnasal drip, rhinorrhea, sinus pressure, sinus pain, sneezing and sore throat.   Eyes: Negative.   Respiratory:  Negative for cough, chest tightness, shortness of breath and wheezing.   Cardiovascular:  Negative for chest pain and palpitations.  Gastrointestinal:  Negative for abdominal pain, constipation,  diarrhea, nausea and vomiting.  Endocrine: Negative for cold intolerance, heat intolerance, polydipsia and polyuria.  Genitourinary:  Negative for dyspareunia, dysuria, flank pain, frequency and urgency.  Musculoskeletal:  Negative for arthralgias, back pain and myalgias.  Skin:  Negative for rash.  Allergic/Immunologic: Negative for environmental allergies.  Neurological:  Negative for dizziness, weakness and headaches.  Hematological:  Negative for adenopathy.  Psychiatric/Behavioral:  The patient is not nervous/anxious.        Objective     Today's Vitals   02/08/22 1457 02/08/22 1530  BP: (Abnormal) 146/76 (Abnormal) 140/78  Pulse: 65 65  SpO2: 99% 100%  Weight: 180 lb 12.8 oz (82 kg)   Height: 5' 2"  (1.575 m)    Body mass index is 33.07 kg/m.  BP Readings from Last 3 Encounters:  02/08/22 (Abnormal) 140/78  10/18/21 132/71  10/11/21 (Abnormal) 119/56    Wt Readings from Last 3 Encounters:  02/08/22 180 lb 12.8 oz (82 kg)  10/18/21 174 lb 6.4 oz (79.1 kg)  10/11/21 163 lb 3.2 oz (74 kg)    Physical Exam Vitals and nursing note reviewed.  Constitutional:      Appearance: Normal appearance. She is well-developed. She is ill-appearing.  HENT:     Head: Normocephalic and atraumatic.     Nose: Nose normal.     Mouth/Throat:     Mouth: Mucous membranes are moist.     Pharynx: Oropharynx is clear.  Eyes:     Conjunctiva/sclera: Conjunctivae normal.     Pupils: Pupils are equal, round, and reactive to light.  Cardiovascular:     Rate and  Rhythm: Normal rate and regular rhythm.     Pulses: Normal pulses.     Heart sounds: Normal heart sounds.  Pulmonary:     Effort: Pulmonary effort is normal.     Breath sounds: Normal breath sounds.  Abdominal:     General: Bowel sounds are normal. There is no distension.     Palpations: Abdomen is soft. There is no mass.     Tenderness: There is abdominal tenderness. There is no right CVA tenderness, left CVA tenderness,  guarding or rebound.     Hernia: No hernia is present.     Comments: Mild tenderness with deep palpation of right upper quadrant and epigastric aspects of the abdomen.   Musculoskeletal:        General: Normal range of motion.     Cervical back: Normal range of motion and neck supple.  Lymphadenopathy:     Cervical: No cervical adenopathy.  Skin:    General: Skin is warm and dry.     Capillary Refill: Capillary refill takes less than 2 seconds.  Neurological:     General: No focal deficit present.     Mental Status: She is alert and oriented to person, place, and time.  Psychiatric:        Mood and Affect: Mood normal.        Behavior: Behavior normal.        Thought Content: Thought content normal.        Judgment: Judgment normal.       Assessment & Plan    1. Epigastric pain We will check labs including CBC, CMP, amylase, lipase, and H. pylori.  Get ultrasound of right upper quadrant for further evaluation. - CBC - Comp Met (CMET) - Amylase - Lipase - Helicobacter pylori abs-IgG+IgA, bld - US Abdomen Limited RUQ (LIVER/GB); Future  2. Other fatigue Check CBC, CMP, amylase, lipase, and H. pylori for further evaluation. - CBC - Comp Met (CMET) - Amylase - Lipase - Helicobacter pylori abs-IgG+IgA, bld  3. Need for influenza vaccination Flu vaccine administered during today's visit.  - Flu Vaccine QUAD 6+ mos PF IM (Fluarix Quad PF)  4. Need for Tdap vaccination Tdap vaccine administered during today's visit.   - Tdap vaccine greater than or equal to 7yo IM   Problem List Items Addressed This Visit       Other   Other fatigue   Relevant Orders   CBC   Comp Met (CMET)   Amylase   Lipase   Helicobacter pylori abs-IgG+IgA, bld   Other Visit Diagnoses     Epigastric pain    -  Primary   Relevant Orders   CBC   Comp Met (CMET)   Amylase   Lipase   Helicobacter pylori abs-IgG+IgA, bld   US Abdomen Limited RUQ (LIVER/GB)   Need for influenza vaccination        Relevant Orders   Flu Vaccine QUAD 6+ mos PF IM (Fluarix Quad PF) (Completed)   Need for Tdap vaccination       Relevant Orders   Tdap vaccine greater than or equal to 7yo IM (Completed)        Return in about 3 months (around 05/11/2022) for mood.         Ronnell Freshwater, NP  [facility] [phone] (phone) [phone] (fax)  [place]

## 2022-02-08 NOTE — Telephone Encounter (Signed)
Please let the patient know that I Rrfilled vyvanse for 30 days. Will need to reschedule appointment for additional fills.  Thanks  -HB

## 2022-02-27 DIAGNOSIS — R1013 Epigastric pain: Secondary | ICD-10-CM | POA: Insufficient documentation

## 2022-03-14 ENCOUNTER — Telehealth: Payer: Self-pay

## 2022-03-14 ENCOUNTER — Other Ambulatory Visit: Payer: Self-pay | Admitting: Nurse Practitioner

## 2022-03-14 DIAGNOSIS — F988 Other specified behavioral and emotional disorders with onset usually occurring in childhood and adolescence: Secondary | ICD-10-CM

## 2022-03-14 MED ORDER — LISDEXAMFETAMINE DIMESYLATE 50 MG PO CAPS: 50.0000 mg | ORAL_CAPSULE | Freq: Every day | ORAL | 0 refills | Status: DC

## 2022-03-14 NOTE — Telephone Encounter (Signed)
Pt left a VM requesting a refill for: lisdexamfetamine (VYVANSE) 50 MG capsule   Pharmacy: [id number] [place], [place] - [address] OF [address]   LOV 02/08/22 ROV not scheduled at this time

## 2022-03-14 NOTE — Telephone Encounter (Signed)
Done and sent  

## 2022-04-12 ENCOUNTER — Ambulatory Visit: Payer: Self-pay | Admitting: Surgery

## 2022-04-12 NOTE — H&P (Addendum)
Subjective:    CC: Non-recurrent unilateral inguinal hernia without obstruction or gangrene [K40.90]   HPI:  Jennifer Branch is a 56 y.o. female who was referred by Self for evaluation of above. Symptoms were first noted several weeks ago. Pain is dull, intermittent, and discomfort, confined to the left groin. Associated with lump, exacerbated by exertion.  Lump is reducible.    Past Medical History:  has a past medical history of GERD (gastroesophageal reflux disease).   Past Surgical History:       Past Surgical History:  Procedure Laterality Date   EXCISION NEUROMA FOOT          Family History: family history is not on file.   Social History:  reports that she has never smoked. She has never used smokeless tobacco. She reports current alcohol use. She reports that she does not use drugs.   Current Medications: currently has no medications in their medication list.   Allergies:       Allergies as of 04/12/2022 - Reviewed 04/12/2022  Allergen Reaction Noted   Shellfish containing products Other (See Comments) and Swelling 05/24/2016   Penicillins Unknown and Other (See Comments) 05/24/2016      ROS:  A 15 point review of systems was performed and pertinent positives and negatives noted in HPI   Objective:    BP (!) 155/79   Pulse 62   Ht 157.5 cm (5\' 2" )   Wt 79.4 kg (175 lb)   SpO2 98%   BMI 32.01 kg/m    Constitutional :  Alert, cooperative, no distress  Lymphatics/Throat:  Supple, no lymphadenopathy  Respiratory:  clear to auscultation bilaterally  Cardiovascular:  regular rate and rhythm  Gastrointestinal: soft, non-tender; bowel sounds normal; no masses,  no organomegaly. inguinal hernia noted.  small, large, no overlying skin changes, and left  Musculoskeletal: Steady gait and movement  Skin: Cool and moist, abdominoplasty surgical scars  Psychiatric: Normal affect, non-agitated, not confused         LABS:  N/a    RADS: N/a Assessment:         Non-recurrent unilateral inguinal hernia without obstruction or gangrene [K40.90], left   Plan:    1. Non-recurrent unilateral inguinal hernia without obstruction or gangrene [K40.90]   Discussed the risk of surgery including recurrence, which can be up to 50% in the case of incisional or complex hernias, possible use of prosthetic materials (mesh) and the increased risk of mesh infxn if used, bleeding, chronic pain, post-op infxn, post-op SBO or ileus, and possible re-operation to address said risks. The risks of general anesthetic, if used, includes MI, CVA, sudden death or even reaction to anesthetic medications also discussed. Alternatives include continued observation.  Benefits include possible symptom relief, prevention of incarceration, strangulation, enlargement in size over time, and the risk of emergency surgery in the face of strangulation.    Typical post-op recovery time of 3-5 days with 2 weeks of activity restrictions were also discussed.   ED return precautions given for sudden increase in pain, size of hernia with accompanying fever, nausea, and/or vomiting.   The patient verbalized understanding and all questions were answered to the patient's satisfaction.     2. Patient has elected to proceed with surgical treatment. Procedure will be scheduled. left, robotic assisted laparoscopic   labs/images/medications/previous chart entries reviewed personally and relevant changes/updates noted abov

## 2022-04-19 ENCOUNTER — Telehealth: Payer: Self-pay | Admitting: *Deleted

## 2022-04-19 ENCOUNTER — Other Ambulatory Visit: Payer: Self-pay | Admitting: Nurse Practitioner

## 2022-04-19 DIAGNOSIS — F988 Other specified behavioral and emotional disorders with onset usually occurring in childhood and adolescence: Secondary | ICD-10-CM

## 2022-04-19 MED ORDER — LISDEXAMFETAMINE DIMESYLATE 50 MG PO CAPS: 50.0000 mg | ORAL_CAPSULE | Freq: Every day | ORAL | 0 refills | Status: DC

## 2022-04-19 NOTE — Telephone Encounter (Signed)
I have sent a new prescription for vyvanse to [facility] for her.

## 2022-04-19 NOTE — Telephone Encounter (Signed)
Pt calling needing refill on below. Dwayne Bulkley Zimmerman Rumple, CMA    lisdexamfetamine (VYVANSE) 50 MG capsule      [id number] - [place], [place]   LOV:02/08/2022 ROV: not scheduled yet.

## 2022-04-20 ENCOUNTER — Other Ambulatory Visit: Payer: Self-pay | Admitting: Nurse Practitioner

## 2022-04-20 ENCOUNTER — Encounter: Payer: Self-pay | Admitting: Nurse Practitioner

## 2022-04-20 DIAGNOSIS — F988 Other specified behavioral and emotional disorders with onset usually occurring in childhood and adolescence: Secondary | ICD-10-CM

## 2022-04-20 NOTE — Telephone Encounter (Signed)
I sent new prescription for this 04/19/2022

## 2022-05-23 ENCOUNTER — Other Ambulatory Visit: Payer: Self-pay | Admitting: Nurse Practitioner

## 2022-05-23 ENCOUNTER — Other Ambulatory Visit: Payer: Self-pay

## 2022-05-23 ENCOUNTER — Encounter
Admission: RE | Admit: 2022-05-23 | Discharge: 2022-05-23 | Disposition: A | Discharge status: Home / Self Care | Payer: Managed Care, Other (non HMO) | Source: Ambulatory Visit | Attending: Surgery | Admitting: Surgery

## 2022-05-23 DIAGNOSIS — Z1231 Encounter for screening mammogram for malignant neoplasm of breast: Secondary | ICD-10-CM

## 2022-05-23 NOTE — Patient Instructions (Signed)
Your procedure is scheduled on: 06/02/22 Report to [place]. To find out your arrival time please call [phone] between 1PM - 3PM on 06/01/22.  Remember: Instructions that are not followed completely may result in serious medical risk, up to and including death, or upon the discretion of your surgeon and anesthesiologist your surgery may need to be rescheduled.     _X__ 1. Do not eat food after midnight the night before your procedure.                 No gum chewing or hard candies. You may drink clear liquids up to 2 hours                 before you are scheduled to arrive for your surgery- DO not drink clear                 liquids within 2 hours of the start of your surgery.                 Clear Liquids include:  water, apple juice without pulp, clear carbohydrate                 drink such as Clearfast or Gatorade, Black Coffee or Tea (Do not add                 anything to coffee or tea). Diabetics water only  ______    Drink the Ensure "clear" pre surgery drink 2 hours before arriving for surgery  ______    Drink the G2 Gatorade drink 2 hours before arriving for surgery   __X__2.  On the morning of surgery brush your teeth with toothpaste and water, you                 may rinse your mouth with mouthwash if you wish.  Do not swallow any              toothpaste of mouthwash.     _X__ 3.  No Alcohol for 24 hours before or after surgery.   _X__ 4.  Do Not Smoke or use e-cigarettes For 24 Hours Prior to Your Surgery.                 Do not use any chewable tobacco products for at least 6 hours prior to                 surgery.  ____  5.  Bring all medications with you on the day of surgery if instructed.   __X__  6.  Notify your doctor if there is any change in your medical condition      (cold, fever, infections).     Do not wear jewelry, make-up, hairpins, clips or nail polish. Do not wear lotions, powders, or  perfumes. You may use deodorant Do not shave body hair 48 hours prior to surgery. Men may shave face and neck. Do not bring valuables to the hospital.    [facility] is not responsible for any belongings or valuables.  Contacts, dentures/partials or body piercings may not be worn into surgery. Bring a case for your contacts, glasses or hearing aids, a denture cup will be supplied. Leave your suitcase or overnight bag in the car. After surgery it may be brought to your room. For patients admitted to the hospital, discharge time is determined by your treatment team. In case of increased patient  census, it may be necessary for you, the patient, to continue your postoperative care within the Same Day Surgery department.   Patients discharged the day of surgery will need a responsible, legally licensed driver to drive them home. A learners permit is NOT acceptable. The driver must be free of impairment including anesthesia medications You will not be allowed to drive yourself home. If you are going home via [place], [place], Taxi or other public transportation (non medical) you MUST be accompanied be a responsible individual.    __X__ Take these medicines the morning of surgery with A SIP OF WATER:    1. none  2.   3.   ____ Fleet Enema (as directed)   ____ Use CHG Soap/SAGE wipes as directed  ____ Use inhalers on the day of surgery  ____ Stop metformin/Janumet/Farxiga 2 days prior to surgery    ____ Take 1/2 of usual insulin dose the night before surgery. No insulin the morning          of surgery.   ____ Stop Blood Thinners Coumadin/Plavix/Xarelto/Pleta/Pradaxa/Eliquis/Effient/Aspirin  on   Or contact your Surgeon, Cardiologist or Medical Doctor regarding  ability to stop your blood thinners  __X__ Stop Anti-inflammatories 7 days before surgery such as Advil, Ibuprofen, Motrin,  BC or Goodies Powder, Naprosyn, Naproxen, Aleve, Aspirin You may take Tylenol as needed   __X__ Stop all  herbals and supplements, fish oil or vitamins 7 days before surgery.      ____ Bring C-Pap to the hospital.

## 2022-06-01 MED ORDER — CELECOXIB 200 MG PO CAPS: 200.0000 mg | ORAL_CAPSULE | ORAL | Status: AC

## 2022-06-01 MED ORDER — ORAL CARE MOUTH RINSE: 15.0000 mL | Freq: Once | OROMUCOSAL | Status: AC

## 2022-06-01 MED ORDER — LACTATED RINGERS IV SOLN: INTRAVENOUS | Status: DC

## 2022-06-01 MED ORDER — CHLORHEXIDINE GLUCONATE CLOTH 2 % EX PADS: 6.0000 | MEDICATED_PAD | Freq: Once | CUTANEOUS | Status: DC

## 2022-06-01 MED ORDER — CEFAZOLIN SODIUM-DEXTROSE 2-4 GM/100ML-% IV SOLN
2.0000 g | INTRAVENOUS | Status: AC
  Administered 2022-06-02: 2 g via INTRAVENOUS

## 2022-06-01 MED ORDER — CHLORHEXIDINE GLUCONATE 0.12 % MT SOLN: 15.0000 mL | Freq: Once | OROMUCOSAL | Status: AC

## 2022-06-01 MED ORDER — GABAPENTIN 300 MG PO CAPS: 300.0000 mg | ORAL_CAPSULE | ORAL | Status: AC

## 2022-06-01 MED ORDER — FAMOTIDINE 20 MG PO TABS: 20.0000 mg | ORAL_TABLET | Freq: Once | ORAL | Status: AC

## 2022-06-01 MED ORDER — ACETAMINOPHEN 500 MG PO TABS: 1000.0000 mg | ORAL_TABLET | ORAL | Status: AC

## 2022-06-02 ENCOUNTER — Encounter
Admission: RE | Disposition: A | Discharge status: Home / Self Care | Payer: Self-pay | Source: Ambulatory Visit | Attending: Surgery

## 2022-06-02 ENCOUNTER — Ambulatory Visit
Admission: RE | Admit: 2022-06-02 | Discharge: 2022-06-02 | Disposition: A | Discharge status: Home / Self Care | Payer: Managed Care, Other (non HMO) | Source: Ambulatory Visit | Attending: Surgery | Admitting: Surgery

## 2022-06-02 ENCOUNTER — Ambulatory Visit: Payer: Managed Care, Other (non HMO) | Admitting: Registered Nurse

## 2022-06-02 ENCOUNTER — Other Ambulatory Visit: Payer: Self-pay

## 2022-06-02 ENCOUNTER — Ambulatory Visit: Payer: Managed Care, Other (non HMO) | Admitting: Urgent Care

## 2022-06-02 ENCOUNTER — Encounter: Payer: Self-pay | Admitting: Surgery

## 2022-06-02 DIAGNOSIS — K4090 Unilateral inguinal hernia, without obstruction or gangrene, not specified as recurrent: Secondary | ICD-10-CM | POA: Insufficient documentation

## 2022-06-02 HISTORY — PX: INSERTION OF MESH: SHX5868

## 2022-06-02 SURGERY — HERNIORRHAPHY, INGUINAL, ROBOT-ASSISTED, LAPAROSCOPIC
Anesthesia: General | Site: Groin | Laterality: Left

## 2022-06-02 MED ORDER — CHLORHEXIDINE GLUCONATE 0.12 % MT SOLN
OROMUCOSAL | Status: AC
  Administered 2022-06-02: 15 mL via OROMUCOSAL
  Filled 2022-06-02: qty 15

## 2022-06-02 MED ORDER — OXYCODONE HCL 5 MG PO TABS
ORAL_TABLET | ORAL | Status: AC
  Administered 2022-06-02: 5 mg via ORAL
  Filled 2022-06-02: qty 1

## 2022-06-02 MED ORDER — BUPIVACAINE LIPOSOME 1.3 % IJ SUSP
INTRAMUSCULAR | Status: DC | PRN
  Administered 2022-06-02: 20 mL

## 2022-06-02 MED ORDER — PROPOFOL 1000 MG/100ML IV EMUL
INTRAVENOUS | Status: AC
  Filled 2022-06-02: qty 100

## 2022-06-02 MED ORDER — CELECOXIB 200 MG PO CAPS
ORAL_CAPSULE | ORAL | Status: AC
  Administered 2022-06-02: 200 mg via ORAL
  Filled 2022-06-02: qty 1

## 2022-06-02 MED ORDER — ONDANSETRON HCL 4 MG/2ML IJ SOLN
4.0000 mg | Freq: Once | INTRAMUSCULAR | Status: AC | PRN
  Administered 2022-06-02: 4 mg via INTRAVENOUS

## 2022-06-02 MED ORDER — MIDAZOLAM HCL 2 MG/2ML IJ SOLN
INTRAMUSCULAR | Status: DC | PRN
  Administered 2022-06-02: 2 mg via INTRAVENOUS

## 2022-06-02 MED ORDER — LIDOCAINE HCL (CARDIAC) PF 100 MG/5ML IV SOSY
PREFILLED_SYRINGE | INTRAVENOUS | Status: DC | PRN
  Administered 2022-06-02: 90 mg via INTRAVENOUS

## 2022-06-02 MED ORDER — IBUPROFEN 800 MG PO TABS: 800.0000 mg | ORAL_TABLET | Freq: Three times a day (TID) | ORAL | 0 refills | Status: DC | PRN

## 2022-06-02 MED ORDER — FENTANYL CITRATE (PF) 100 MCG/2ML IJ SOLN
25.0000 ug | INTRAMUSCULAR | Status: DC | PRN
  Administered 2022-06-02 (×4): 25 ug via INTRAVENOUS

## 2022-06-02 MED ORDER — FENTANYL CITRATE (PF) 100 MCG/2ML IJ SOLN
INTRAMUSCULAR | Status: AC
  Filled 2022-06-02: qty 2

## 2022-06-02 MED ORDER — BUPIVACAINE HCL (PF) 0.5 % IJ SOLN
INTRAMUSCULAR | Status: AC
  Filled 2022-06-02: qty 30

## 2022-06-02 MED ORDER — DEXAMETHASONE SODIUM PHOSPHATE 10 MG/ML IJ SOLN
INTRAMUSCULAR | Status: DC | PRN
  Administered 2022-06-02: 10 mg via INTRAVENOUS

## 2022-06-02 MED ORDER — PROPOFOL 10 MG/ML IV BOLUS
INTRAVENOUS | Status: DC | PRN
  Administered 2022-06-02: 150 mg via INTRAVENOUS

## 2022-06-02 MED ORDER — PROPOFOL 10 MG/ML IV BOLUS
INTRAVENOUS | Status: AC
  Filled 2022-06-02: qty 20

## 2022-06-02 MED ORDER — HYDROCODONE-ACETAMINOPHEN 5-325 MG PO TABS: 1.0000 | ORAL_TABLET | Freq: Four times a day (QID) | ORAL | 0 refills | Status: DC | PRN

## 2022-06-02 MED ORDER — FAMOTIDINE 20 MG PO TABS
ORAL_TABLET | ORAL | Status: AC
  Administered 2022-06-02: 20 mg via ORAL
  Filled 2022-06-02: qty 1

## 2022-06-02 MED ORDER — OXYCODONE HCL 5 MG PO TABS
5.0000 mg | ORAL_TABLET | Freq: Once | ORAL | Status: AC
  Administered 2022-06-02: 5 mg via ORAL

## 2022-06-02 MED ORDER — BUPIVACAINE LIPOSOME 1.3 % IJ SUSP
INTRAMUSCULAR | Status: AC
  Filled 2022-06-02: qty 20

## 2022-06-02 MED ORDER — DOCUSATE SODIUM 100 MG PO CAPS: 100.0000 mg | ORAL_CAPSULE | Freq: Two times a day (BID) | ORAL | 0 refills | Status: AC | PRN

## 2022-06-02 MED ORDER — PROPOFOL 500 MG/50ML IV EMUL
INTRAVENOUS | Status: DC | PRN
  Administered 2022-06-02: 125 ug/kg/min via INTRAVENOUS

## 2022-06-02 MED ORDER — ONDANSETRON HCL 4 MG/2ML IJ SOLN
INTRAMUSCULAR | Status: AC
  Filled 2022-06-02: qty 2

## 2022-06-02 MED ORDER — GABAPENTIN 300 MG PO CAPS
ORAL_CAPSULE | ORAL | Status: AC
  Administered 2022-06-02: 300 mg via ORAL
  Filled 2022-06-02: qty 1

## 2022-06-02 MED ORDER — ACETAMINOPHEN 325 MG PO TABS: 650.0000 mg | ORAL_TABLET | Freq: Three times a day (TID) | ORAL | 0 refills | Status: AC | PRN

## 2022-06-02 MED ORDER — CEFAZOLIN SODIUM-DEXTROSE 2-4 GM/100ML-% IV SOLN
INTRAVENOUS | Status: AC
  Filled 2022-06-02: qty 100

## 2022-06-02 MED ORDER — MIDAZOLAM HCL 2 MG/2ML IJ SOLN
INTRAMUSCULAR | Status: AC
  Filled 2022-06-02: qty 2

## 2022-06-02 MED ORDER — FENTANYL CITRATE (PF) 100 MCG/2ML IJ SOLN
INTRAMUSCULAR | Status: DC | PRN
  Administered 2022-06-02: 50 ug via INTRAVENOUS
  Administered 2022-06-02 (×2): 25 ug via INTRAVENOUS

## 2022-06-02 MED ORDER — ONDANSETRON HCL 4 MG/2ML IJ SOLN
INTRAMUSCULAR | Status: DC | PRN
  Administered 2022-06-02: 4 mg via INTRAVENOUS

## 2022-06-02 MED ORDER — SUGAMMADEX SODIUM 200 MG/2ML IV SOLN
INTRAVENOUS | Status: DC | PRN
  Administered 2022-06-02: 200 mg via INTRAVENOUS

## 2022-06-02 MED ORDER — BUPIVACAINE-EPINEPHRINE 0.5% -1:200000 IJ SOLN: INTRAMUSCULAR | Status: DC | PRN

## 2022-06-02 MED ORDER — BUPIVACAINE HCL (PF) 0.5 % IJ SOLN
INTRAMUSCULAR | Status: DC | PRN
  Administered 2022-06-02: 15 mL

## 2022-06-02 MED ORDER — ROCURONIUM BROMIDE 100 MG/10ML IV SOLN
INTRAVENOUS | Status: DC | PRN
  Administered 2022-06-02: 50 mg via INTRAVENOUS
  Administered 2022-06-02 (×2): 20 mg via INTRAVENOUS

## 2022-06-02 MED ORDER — ACETAMINOPHEN 500 MG PO TABS
ORAL_TABLET | ORAL | Status: AC
  Administered 2022-06-02: 1000 mg via ORAL
  Filled 2022-06-02: qty 2

## 2022-06-02 SURGICAL SUPPLY — 50 items
BAG PRESSURE INF REUSE 1000 (BAG) IMPLANT
BLADE SURG SZ11 CARB STEEL (BLADE) ×2 IMPLANT
BNDG GAUZE DERMACEA FLUFF 4 (GAUZE/BANDAGES/DRESSINGS) ×2 IMPLANT
COVER TIP SHEARS 8 DVNC (MISCELLANEOUS) ×2 IMPLANT
COVER TIP SHEARS 8MM DA VINCI (MISCELLANEOUS) ×2
COVER WAND RF STERILE (DRAPES) ×2 IMPLANT
DERMABOND ADVANCED .7 DNX12 (GAUZE/BANDAGES/DRESSINGS) ×2 IMPLANT
DRAPE ARM DVNC X/XI (DISPOSABLE) ×6 IMPLANT
DRAPE COLUMN DVNC XI (DISPOSABLE) ×2 IMPLANT
DRAPE DA VINCI XI ARM (DISPOSABLE) ×6
DRAPE DA VINCI XI COLUMN (DISPOSABLE) ×2
ELECT CAUTERY BLADE 6.4 (BLADE) IMPLANT
ELECT REM PT RETURN 9FT ADLT (ELECTROSURGICAL) ×2
ELECTRODE REM PT RTRN 9FT ADLT (ELECTROSURGICAL) ×2 IMPLANT
GLOVE BIOGEL PI IND STRL 7.0 (GLOVE) ×4 IMPLANT
GLOVE SURG SYN 6.5 ES PF (GLOVE) ×6 IMPLANT
GLOVE SURG SYN 6.5 PF PI (GLOVE) ×4 IMPLANT
GOWN STRL REUS W/ TWL LRG LVL3 (GOWN DISPOSABLE) ×6 IMPLANT
GOWN STRL REUS W/TWL LRG LVL3 (GOWN DISPOSABLE) ×6
IRRIGATOR SUCT 8 DISP DVNC XI (IRRIGATION / IRRIGATOR) IMPLANT
IRRIGATOR SUCTION 8MM XI DISP (IRRIGATION / IRRIGATOR) ×2
IV NS 1000ML (IV SOLUTION) ×2
IV NS 1000ML BAXH (IV SOLUTION) IMPLANT
LABEL OR SOLS (LABEL) IMPLANT
MANIFOLD NEPTUNE II (INSTRUMENTS) ×2 IMPLANT
MESH 3DMAX MID 4X6 LT LRG (Mesh General) IMPLANT
MESH 3DMAX MID 4X6 RT LRG (Mesh General) IMPLANT
NDL INSUFFLATION 14GA 120MM (NEEDLE) ×2 IMPLANT
NEEDLE HYPO 22GX1.5 SAFETY (NEEDLE) ×2 IMPLANT
NEEDLE INSUFFLATION 14GA 120MM (NEEDLE) ×2 IMPLANT
OBTURATOR OPTICAL STANDARD 8MM (TROCAR) ×2
OBTURATOR OPTICAL STND 8 DVNC (TROCAR) ×2
OBTURATOR OPTICALSTD 8 DVNC (TROCAR) ×2 IMPLANT
PACK LAP CHOLECYSTECTOMY (MISCELLANEOUS) ×2 IMPLANT
PENCIL SMOKE EVACUATOR (MISCELLANEOUS) ×2 IMPLANT
SEAL CANN UNIV 5-8 DVNC XI (MISCELLANEOUS) ×6 IMPLANT
SEAL XI 5MM-8MM UNIVERSAL (MISCELLANEOUS) ×6
SET TUBE SMOKE EVAC HIGH FLOW (TUBING) ×2 IMPLANT
SOL ELECTROSURG ANTI STICK (MISCELLANEOUS) ×2
SOLUTION ELECTROSURG ANTI STCK (MISCELLANEOUS) ×2 IMPLANT
SUT MNCRL 4-0 (SUTURE) ×2
SUT MNCRL 4-0 27XMFL (SUTURE) ×2
SUT VIC AB 2-0 SH 27 (SUTURE) ×2
SUT VIC AB 2-0 SH 27XBRD (SUTURE) ×2 IMPLANT
SUT VLOC 90 6 CV-15 VIOLET (SUTURE) ×4 IMPLANT
SUTURE MNCRL 4-0 27XMF (SUTURE) ×2 IMPLANT
SYR 30ML LL (SYRINGE) ×2 IMPLANT
TAPE TRANSPORE STRL 2 31045 (GAUZE/BANDAGES/DRESSINGS) ×2 IMPLANT
TRAP FLUID SMOKE EVACUATOR (MISCELLANEOUS) ×2 IMPLANT
WATER STERILE IRR 500ML POUR (IV SOLUTION) ×2 IMPLANT

## 2022-06-02 NOTE — H&P (Signed)
Subjective:  CC: Non-recurrent unilateral inguinal hernia without obstruction or gangrene [K40.90]  HPI: Jennifer Branch is a 57 y.o. female who was referred by Self for evaluation of above. Symptoms were first noted several weeks ago. Pain is dull, intermittent, and discomfort, confined to the left groin. Associated with lump, exacerbated by exertion. Lump is reducible.  Past Medical History: has a past medical history of GERD (gastroesophageal reflux disease).  Past Surgical History: Past Surgical History: Procedure Laterality Date EXCISION NEUROMA FOOT  Family History: family history is not on file.  Social History: reports that she has never smoked. She has never used smokeless tobacco. She reports current alcohol use. She reports that she does not use drugs.  Current Medications: currently has no medications in their medication list.  Allergies: Allergies as of 04/12/2022 - Reviewed 04/12/2022 Allergen Reaction Noted Shellfish containing products Other (See Comments) and Swelling 05/24/2016 Penicillins Unknown and Other (See Comments) 05/24/2016  ROS: A 15 point review of systems was performed and pertinent positives and negatives noted in HPI  Objective:   BP (!) 155/79  Pulse 62  Ht 157.5 cm (5' 2"$ )  Wt 79.4 kg (175 lb)  SpO2 98%  BMI 32.01 kg/m  Constitutional : Alert, cooperative, no distress Lymphatics/Throat: Supple, no lymphadenopathy Respiratory: clear to auscultation bilaterally Cardiovascular: regular rate and rhythm Gastrointestinal: soft, non-tender; bowel sounds normal; no masses, no organomegaly. inguinal hernia noted. small, large, no overlying skin changes, and left Musculoskeletal: Steady gait and movement Skin: Cool and moist, abdominoplasty surgical scars Psychiatric: Normal affect, non-agitated, not confused   LABS: N/a  RADS: N/a Assessment:   Non-recurrent unilateral inguinal hernia without obstruction or gangrene [K40.90],  left  Plan:   1. Non-recurrent unilateral inguinal hernia without obstruction or gangrene [K40.90] Discussed the risk of surgery including recurrence, which can be up to 50% in the case of incisional or complex hernias, possible use of prosthetic materials (mesh) and the increased risk of mesh infxn if used, bleeding, chronic pain, post-op infxn, post-op SBO or ileus, and possible re-operation to address said risks. The risks of general anesthetic, if used, includes MI, CVA, sudden death or even reaction to anesthetic medications also discussed. Alternatives include continued observation. Benefits include possible symptom relief, prevention of incarceration, strangulation, enlargement in size over time, and the risk of emergency surgery in the face of strangulation.  Typical post-op recovery time of 3-5 days with 2 weeks of activity restrictions were also discussed.  ED return precautions given for sudden increase in pain, size of hernia with accompanying fever, nausea, and/or vomiting.  The patient verbalized understanding and all questions were answered to the patient's satisfaction.  2. Patient has elected to proceed with surgical treatment. Procedure will be scheduled. left, robotic assisted laparoscopic  labs/images/medications/previous chart entries reviewed personally and relevant changes/updates noted above.

## 2022-06-02 NOTE — Transfer of Care (Signed)
Immediate Anesthesia Transfer of Care Note  Patient: Jennifer Branch  Procedure(s) Performed: XI ROBOTIC ASSISTED INGUINAL HERNIA (Left: Groin) INSERTION OF MESH  Patient Location: PACU  Anesthesia Type:General  Level of Consciousness: awake, alert , and oriented  Airway & Oxygen Therapy: Patient Spontanous Breathing  Post-op Assessment: Report given to RN and Post -op Vital signs reviewed and stable  Post vital signs: Reviewed and stable  Last Vitals:  Vitals Value Taken Time  BP 140/84 06/02/22 1031  Temp 97.3   Pulse 70 06/02/22 1035  Resp 14 06/02/22 1035  SpO2 98 % 06/02/22 1035  Vitals shown include unvalidated device data.  Last Pain:  Vitals:   06/02/22 0735  TempSrc: Oral  PainSc: 0-No pain         Complications: No notable events documented.

## 2022-06-02 NOTE — Anesthesia Preprocedure Evaluation (Signed)
Anesthesia Evaluation  Patient identified by MRN, date of birth, ID band Patient awake    Reviewed: Allergy & Precautions, NPO status , Patient's Chart, lab work & pertinent test results  History of Anesthesia Complications (+) PONV and history of anesthetic complications  Airway Mallampati: III  TM Distance: >3 FB Neck ROM: full    Dental  (+) Teeth Intact   Pulmonary neg pulmonary ROS, former smoker   Pulmonary exam normal breath sounds clear to auscultation       Cardiovascular negative cardio ROS Normal cardiovascular exam Rhythm:Regular Rate:Normal     Neuro/Psych  PSYCHIATRIC DISORDERS Anxiety Depression    negative neurological ROS  negative psych ROS   GI/Hepatic negative GI ROS, Neg liver ROS,,,  Endo/Other  negative endocrine ROS    Renal/GU negative Renal ROS     Musculoskeletal   Abdominal  (+) + obese  Peds negative pediatric ROS (+)  Hematology negative hematology ROS (+)   Anesthesia Other Findings Past Medical History: No date: ADHD No date: Anxiety No date: GERD (gastroesophageal reflux disease) No date: PONV (postoperative nausea and vomiting) No date: Wears contact lenses  Past Surgical History: No date: BRACHIOPLASTY; Left 2011: BREAST BIOPSY; Left     Comment:  neg 12/01/2020: BREAST BIOPSY; Right     Comment:  stereo bx of mass, path pending, coil marker 10/11/2021: COLONOSCOPY WITH PROPOFOL; N/A     Comment:  Procedure: COLONOSCOPY WITH PROPOFOL;  Surgeon: Lucilla Lame, MD;  Location: [facility];  Service:               Endoscopy;  Laterality: N/A; No date: FOOT SURGERY; Left No date: INNER THIGH LIFT No date: tummy tuck  BMI    Body Mass Index: 32.92 kg/m      Reproductive/Obstetrics negative OB ROS                              Anesthesia Physical Anesthesia Plan  ASA: 2  Anesthesia Plan: General   Post-op Pain  Management:    Induction: Intravenous  PONV Risk Score and Plan: Ondansetron, Dexamethasone, Midazolam and Treatment may vary due to age or medical condition  Airway Management Planned: Oral ETT  Additional Equipment:   Intra-op Plan:   Post-operative Plan: Extubation in OR  Informed Consent: I have reviewed the patients History and Physical, chart, labs and discussed the procedure including the risks, benefits and alternatives for the proposed anesthesia with the patient or authorized representative who has indicated his/her understanding and acceptance.     Dental Advisory Given  Plan Discussed with: CRNA and Surgeon  Anesthesia Plan Comments:          Anesthesia Quick Evaluation

## 2022-06-02 NOTE — Op Note (Signed)
Preoperative diagnosis: Left, initial, reducible inguinal Hernia.  Postoperative diagnosis: Left, initial, reducible inguinal Hernia  Procedure: Robotic assisted laparoscopic left inguinal hernia repair with mesh  Anesthesia: General  Surgeon: Dr. Lysle Pearl  Wound Classification: Clean  Specimen: none  Complications: None  Estimated Blood Loss: 37m   Indications:  inguinal hernia. Repair was indicated to avoid complications of incarceration, obstruction and pain, and a prosthetic mesh repair was elected.  See H&P for further details.  Findings: 1.  Round ligament identified and preserved 2. Bard 3D max medium weight mesh used for repair 3. Adequate hemostasis achieved  Description of procedure: The patient was taken to the operating room. A time-out was completed verifying correct patient, procedure, site, positioning, and implant(s) and/or special equipment prior to beginning this procedure.  Area was prepped and draped in the usual sterile fashion. An incision was marked 20 cm above the pubic tubercle, slightly above the umbilicus    Veress needle inserted at palmer's point.  Saline drop test noted to be positive with gradual increase in pressure after initiation of gas insufflation.  15 mm of pressure was achieved prior to removing the Veress needle and then placing a 8 mm port via the Optiview technique through the supraumbilical site.  Inspection of the area afterwards noted no injury to the surrounding organs during insertion of the needle and the port.  2 port sites were marked 8 cm to the lateral sides of the initial port, and a 8 mm robotic port was placed on the left side, another 8 mm robotic port on the right side under direct supervision.  Local anesthesia  infused to the preplanned incision sites prior to insertion of the port.  The [place] then brought into the operative field and docked to the ports.  Examination of the abdominal cavity noted a left  inguinal hernia.  A peritoneal flap was created approximately 8cm cephalad to the defect by using scissors with electrocautery.  Dissection was carried down towards the pubic tubercle, developing the myopectineal orifice view.  Laterally the flap was carried towards the ASIS.  Medium size hernia sac was noted, which carefully dissected away from the adjacent tissues to be fully reduced out of hernia cavity.  Any bleeding was controlled with combination of electrocautery and manual pressure.    After confirming adequate dissection and the peritoneal reflection completely down and away from the round ligament, a Large Bard 3DMax medium weight mesh was placed within the anterior abdominal wall, secured in place using 2-0 Vicryl on an SH needle immediately above the pubic tubercle.  After noting proper placement of the mesh with the peritoneal reflection deep to it, the previously created peritoneal flap was secured back up to the anterior abdominal wall using running 3-0 V-Lock.  Both needles were then removed out of the abdominal cavity, Xi platform undocked from the ports and removed off of operative field.  exparel infused as ilioinguinal block.  Abdomen then desufflated and ports removed. All the skin incisions were then closed with a subcuticular stitch of Monocryl 4-0. Dermabond was applied. The patient tolerated the procedure well and was taken to the postanesthesia care unit in stable condition. Sponge and instrument count correct at end of procedure.

## 2022-06-02 NOTE — Interval H&P Note (Signed)
History and Physical Interval Note:  06/02/2022 8:19 AM  Jennifer Branch  has presented today for surgery, with the diagnosis of Non-recurrent unilateral inguinal hernia without obstruction or gangrene K40.90.  The various methods of treatment have been discussed with the patient and family. After consideration of risks, benefits and other options for treatment, the patient has consented to  Procedure(s): XI ROBOTIC [place] (Left) as a surgical intervention.  The patient's history has been reviewed, patient examined, no change in status, stable for surgery.  I have reviewed the patient's chart and labs.  Questions were answered to the patient's satisfaction.     Deamber Buckhalter Lysle Pearl

## 2022-06-02 NOTE — Anesthesia Postprocedure Evaluation (Signed)
Anesthesia Post Note  Patient: Jennifer Branch  Procedure(s) Performed: XI ROBOTIC ASSISTED INGUINAL HERNIA (Left: Groin) INSERTION OF MESH  Patient location during evaluation: PACU Anesthesia Type: General Level of consciousness: awake and alert Pain management: satisfactory to patient Vital Signs Assessment: post-procedure vital signs reviewed and stable Respiratory status: spontaneous breathing and respiratory function stable Cardiovascular status: stable Anesthetic complications: no   No notable events documented.   Last Vitals:  Vitals:   06/02/22 1100 06/02/22 1127  BP: 135/72 (!) 151/76  Pulse: 63 60  Resp: 11 14  Temp: (!) 36.3 C 36.6 C  SpO2: 100% 100%    Last Pain:  Vitals:   06/02/22 1127  TempSrc: Temporal  PainSc: 3                  VAN STAVEREN,Charman Blasco

## 2022-06-02 NOTE — Discharge Instructions (Addendum)
Hernia repair, Care After This sheet gives you information about how to care for yourself after your procedure. Your health care provider may also give you more specific instructions. If you have problems or questions, contact your health care provider. What can I expect after the procedure? After your procedure, it is common to have the following: Pain in your abdomen, especially in the incision areas. You will be given medicine to control the pain. Tiredness. This is a normal part of the recovery process. Your energy level will return to normal over the next several weeks. Changes in your bowel movements, such as constipation or needing to go more often. Talk with your health care provider about how to manage this. Follow these instructions at home: Medicines  tylenol and advil as needed for discomfort.  Please alternate between the two every four hours as needed for pain.    Use narcotics, if prescribed, only when tylenol and motrin is not enough to control pain.  325-650mg every 8hrs to max of 3000mg/24hrs (including the 325mg in every norco dose) for the tylenol.    Advil up to 800mg per dose every 8hrs as needed for pain.   PLEASE RECORD NUMBER OF PILLS TAKEN UNTIL NEXT FOLLOW UP APPT.  THIS WILL HELP DETERMINE HOW READY YOU ARE TO BE RELEASED FROM ANY ACTIVITY RESTRICTIONS Do not drive or use heavy machinery while taking prescription pain medicine. Do not drink alcohol while taking prescription pain medicine.  Incision care    Follow instructions from your health care provider about how to take care of your incision areas. Make sure you: Keep your incisions clean and dry. Wash your hands with soap and water before and after applying medicine to the areas, and before and after changing your bandage (dressing). If soap and water are not available, use hand sanitizer. Change your dressing as told by your health care provider. Leave stitches (sutures), skin glue, or adhesive strips in  place. These skin closures may need to stay in place for 2 weeks or longer. If adhesive strip edges start to loosen and curl up, you may trim the loose edges. Do not remove adhesive strips completely unless your health care provider tells you to do that. Do not wear tight clothing over the incisions. Tight clothing may rub and irritate the incision areas, which may cause the incisions to open. Do not take baths, swim, or use a hot tub until your health care provider approves. OK TO SHOWER IN 24HRS.   Check your incision area every day for signs of infection. Check for: More redness, swelling, or pain. More fluid or blood. Warmth. Pus or a bad smell. Activity Avoid lifting anything that is heavier than 10 lb (4.5 kg) for 2 weeks or until your health care provider says it is okay. No pushing/pulling greater than 30lbs You may resume normal activities as told by your health care provider. Ask your health care provider what activities are safe for you. Take rest breaks during the day as needed. Eating and drinking Follow instructions from your health care provider about what you can eat after surgery. To prevent or treat constipation while you are taking prescription pain medicine, your health care provider may recommend that you: Drink enough fluid to keep your urine clear or pale yellow. Take over-the-counter or prescription medicines. Eat foods that are high in fiber, such as fresh fruits and vegetables, whole grains, and beans. Limit foods that are high in fat and processed sugars, such as fried and   sweet foods. General instructions Ask your health care provider when you will need an appointment to get your sutures or staples removed. Keep all follow-up visits as told by your health care provider. This is important. Contact a health care provider if: You have more redness, swelling, or pain around your incisions. You have more fluid or blood coming from the incisions. Your incisions feel  warm to the touch. You have pus or a bad smell coming from your incisions or your dressing. You have a fever. You have an incision that breaks open (edges not staying together) after sutures or staples have been removed. You develop a rash. You have chest pain or difficulty breathing. You have pain or swelling in your legs. You feel light-headed or you faint. Your abdomen swells (becomes distended). You have nausea or vomiting. You have blood in your stool (feces). This information is not intended to replace advice given to you by your health care provider. Make sure you discuss any questions you have with your health care provider. Document Released: 10/22/2004 Document Revised: 12/22/2017 Document Reviewed: 01/04/2016 Elsevier Interactive Patient Education  2019 [place]   The drugs that you were given will stay in your system until tomorrow so for the next 24 hours you should not:  Drive an automobile Make any legal decisions Drink any alcoholic beverage   You may resume regular meals tomorrow.  Today it is better to start with liquids and gradually work up to solid foods.  You may eat anything you prefer, but it is better to start with liquids, then soup and crackers, and gradually work up to solid foods.   Please notify your doctor immediately if you have any unusual bleeding, trouble breathing, redness and pain at the surgery site, drainage, fever, or pain not relieved by medication.      Additional Instructions:

## 2022-06-02 NOTE — Anesthesia Procedure Notes (Signed)
Procedure Name: Intubation Date/Time: 06/02/2022 9:03 AM  Performed by: Salome Cozby, [place], CRNAPre-anesthesia Checklist: Patient identified, Patient being monitored, Timeout performed, Emergency Drugs available and Suction available Patient Re-evaluated:Patient Re-evaluated prior to induction Oxygen Delivery Method: Circle system utilized Preoxygenation: Pre-oxygenation with 100% oxygen Induction Type: IV induction Ventilation: Mask ventilation without difficulty Laryngoscope Size: Mac and 3 Grade View: Grade I Tube type: Oral Tube size: 7.0 mm Number of attempts: 1 Airway Equipment and Method: Stylet Placement Confirmation: ETT inserted through vocal cords under direct vision, positive ETCO2 and breath sounds checked- equal and bilateral Secured at: 22 cm Tube secured with: Tape Dental Injury: Teeth and Oropharynx as per pre-operative assessment

## 2022-06-22 ENCOUNTER — Ambulatory Visit
Admission: RE | Admit: 2022-06-22 | Discharge: 2022-06-22 | Disposition: A | Discharge status: Home / Self Care | Payer: Managed Care, Other (non HMO) | Source: Ambulatory Visit | Attending: Nurse Practitioner | Admitting: Nurse Practitioner

## 2022-06-22 DIAGNOSIS — Z1231 Encounter for screening mammogram for malignant neoplasm of breast: Secondary | ICD-10-CM | POA: Insufficient documentation

## 2022-06-28 NOTE — Progress Notes (Signed)
Negative mammogram. Repeat in one year.

## 2022-08-21 IMAGING — MG MM DIGITAL DIAGNOSTIC UNILAT*R* W/ TOMO W/ CAD
6 of 10 series · 6 of 30 positions shown · non-contrast
Comparison: Previous exam(s).

CLINICAL DATA: Recall for possible right breast mass.

EXAM:
DIGITAL DIAGNOSTIC UNILATERAL RIGHT MAMMOGRAM WITH TOMOSYNTHESIS AND
CAD; ULTRASOUND RIGHT BREAST LIMITED
TECHNIQUE: Right digital diagnostic mammography and breast tomosynthesis was
performed. The images were evaluated with computer-aided detection.;
Targeted ultrasound examination of the right breast was performed

[R CC synth-2D (1 of 3)]
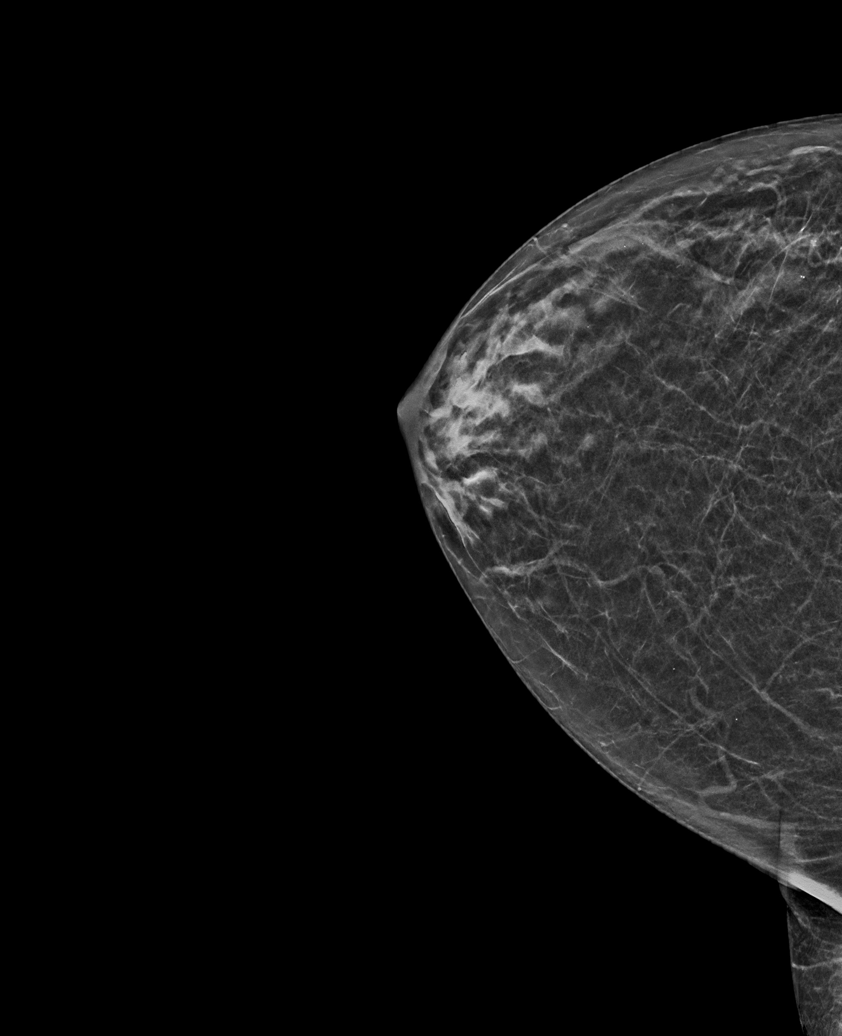

[R CC synth-2D (2 of 3)]
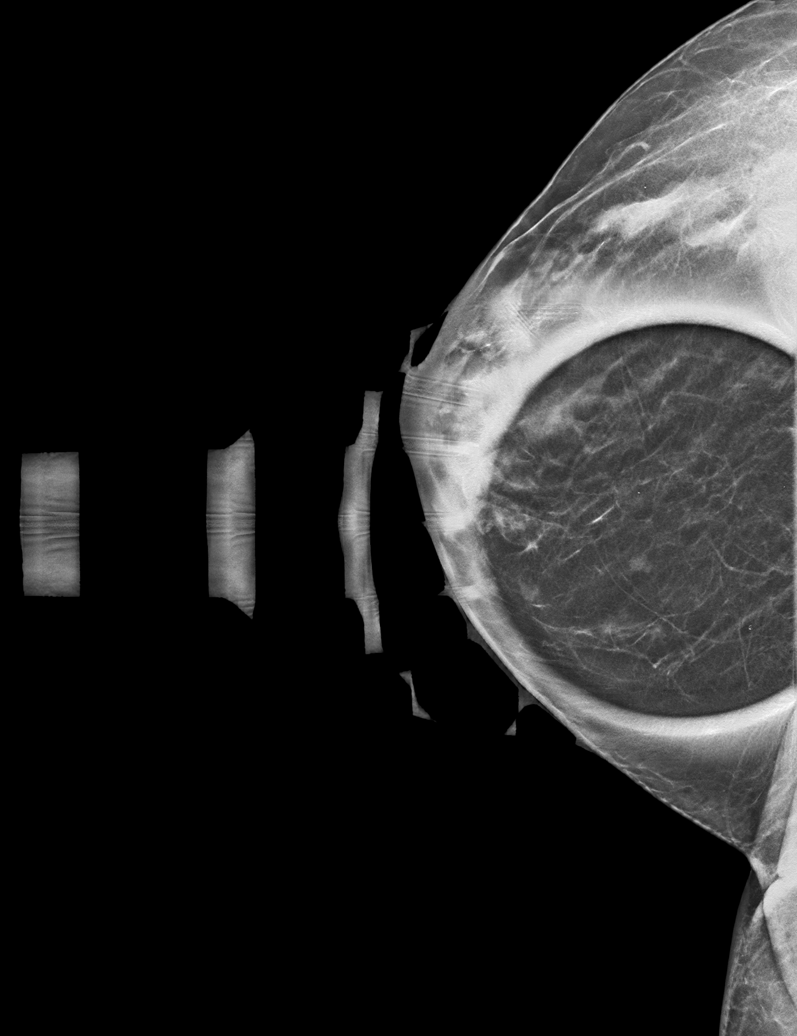

[R ML synth-2D (1 of 2)]
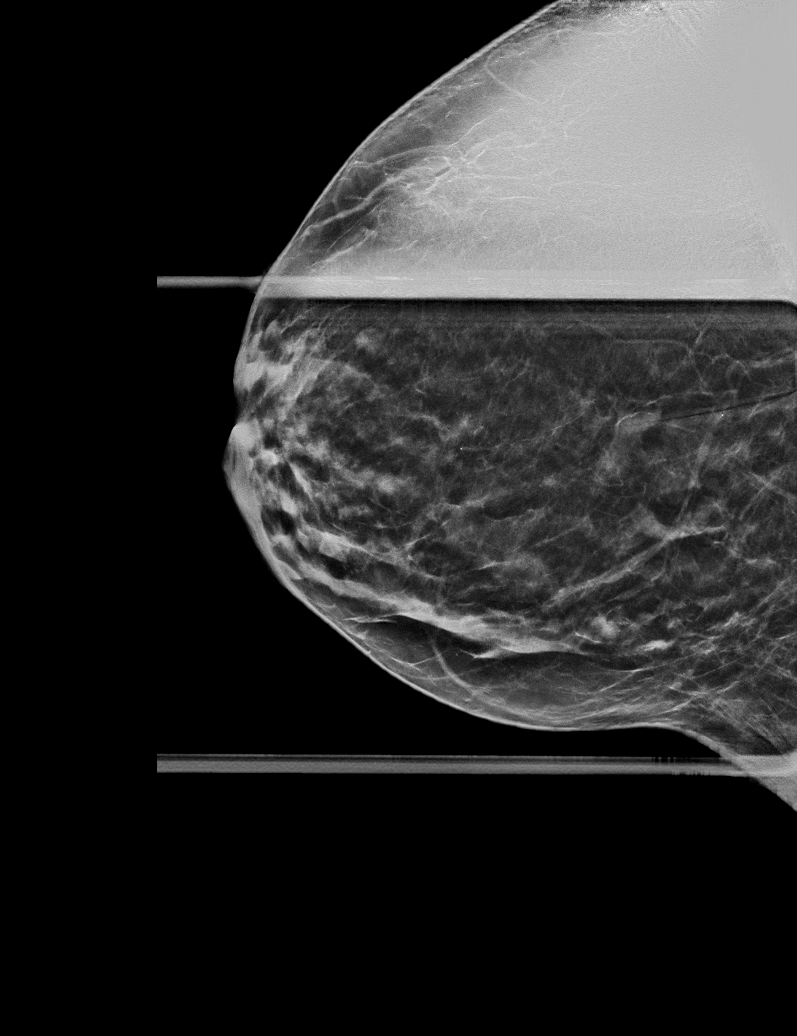

[R CC synth-2D (3 of 3)]
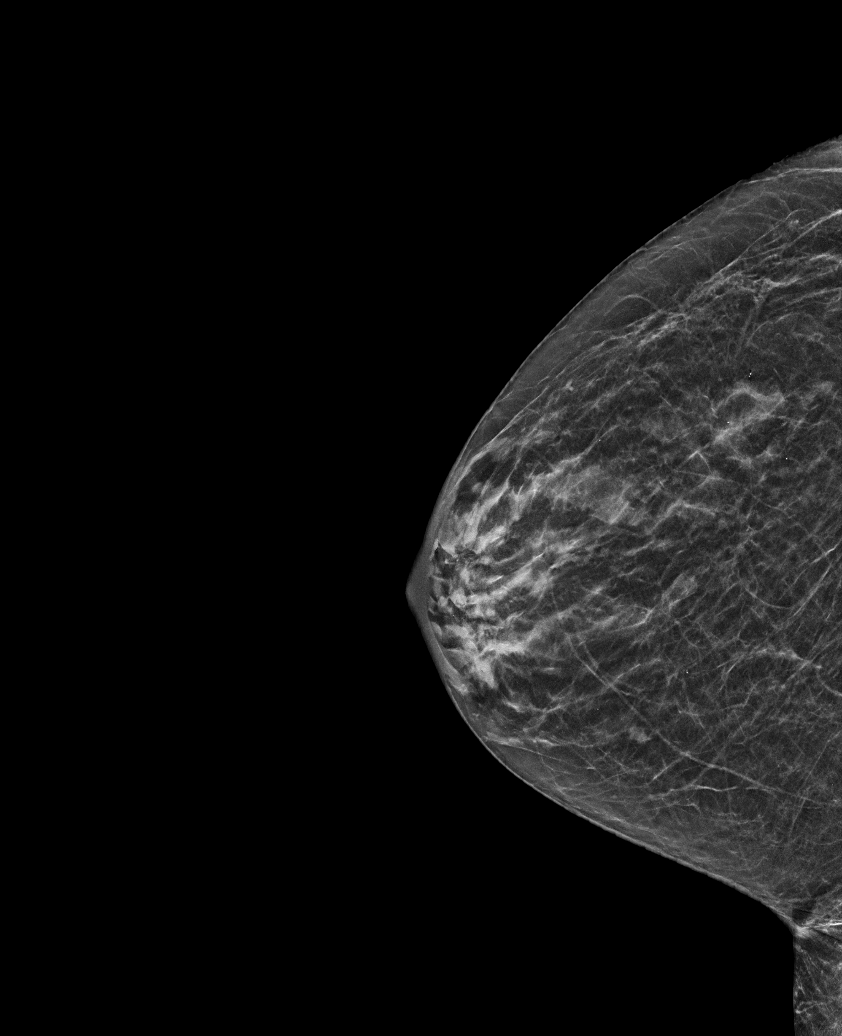

[R ML synth-2D (2 of 2)]
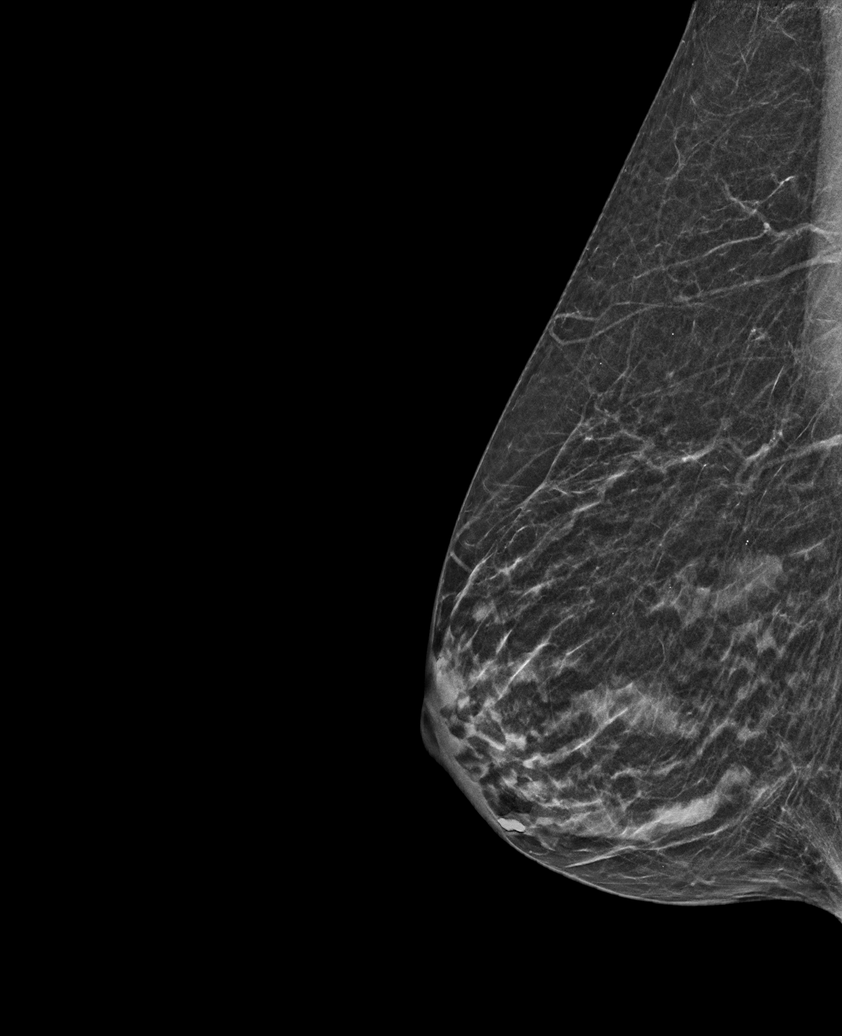

[R ML tomo · tomo slice 25/49.0]
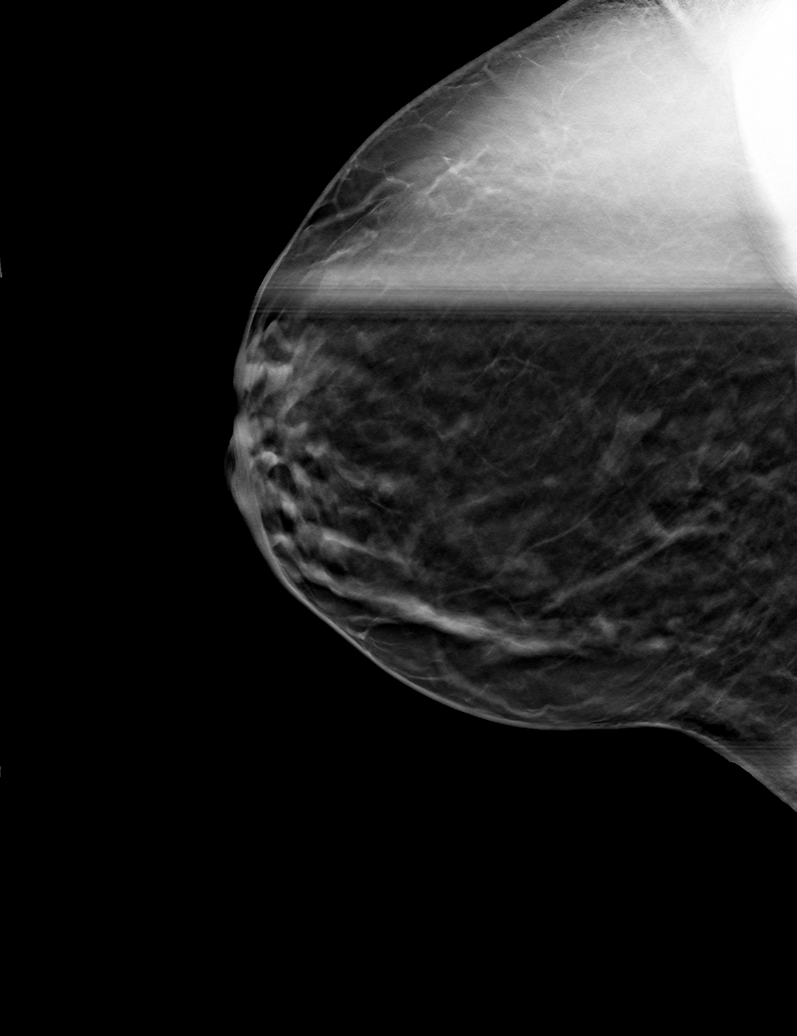

[6 of 30 positions shown; findings below may reference images not displayed]

ACR Breast Density Category c: The breast tissue is heterogeneously
dense, which may obscure small masses.
FINDINGS: The previously described possible mass in the right inner breast
persists on additional views as a 0.4 cm oval mass with indistinct
margins in the lower inner right breast at middle depth.

Targeted ultrasound of the lower inner right breast on the 3 -6
o'clock positions was performed by both the sonographer and the
physician. No sonographic correlate for the mammographic finding is
identified.
IMPRESSION: New 0.4 cm mass at the lower inner right breast at middle depth
without sonographic correlate. Recommend stereotactic guided biopsy.

RECOMMENDATION:
Stereotactic guided biopsy of the right breast.

I have discussed the findings and recommendations with the patient.
If applicable, a reminder letter will be sent to the patient
regarding the next appointment.

BI-RADS CATEGORY  4: Suspicious.

## 2022-08-21 IMAGING — US US BREAST*R* LIMITED INC AXILLA
1 series · 4 of 4 positions shown · non-contrast
Comparison: Previous exam(s).

CLINICAL DATA: Recall for possible right breast mass.

EXAM:
DIGITAL DIAGNOSTIC UNILATERAL RIGHT MAMMOGRAM WITH TOMOSYNTHESIS AND
CAD; ULTRASOUND RIGHT BREAST LIMITED
TECHNIQUE: Right digital diagnostic mammography and breast tomosynthesis was
performed. The images were evaluated with computer-aided detection.;
Targeted ultrasound examination of the right breast was performed

[Series 2: us breast*right* limited inc axilla · 0.03mm/px · 4 of 4 slices shown]
[im 1/4]
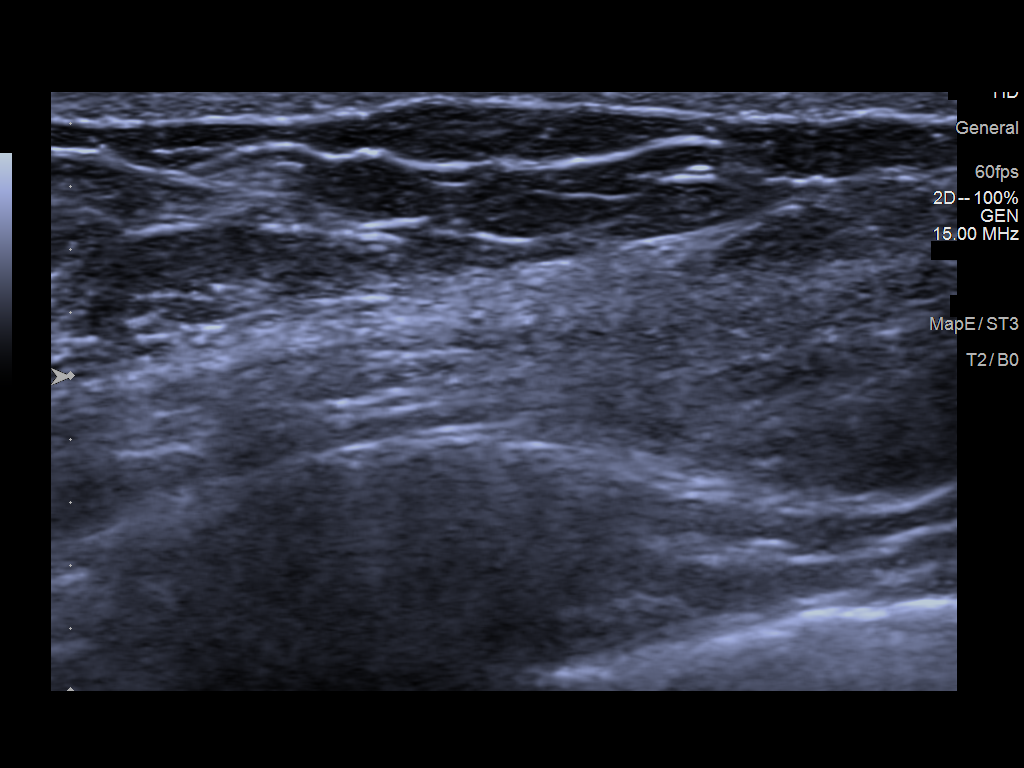
[im 2/4]
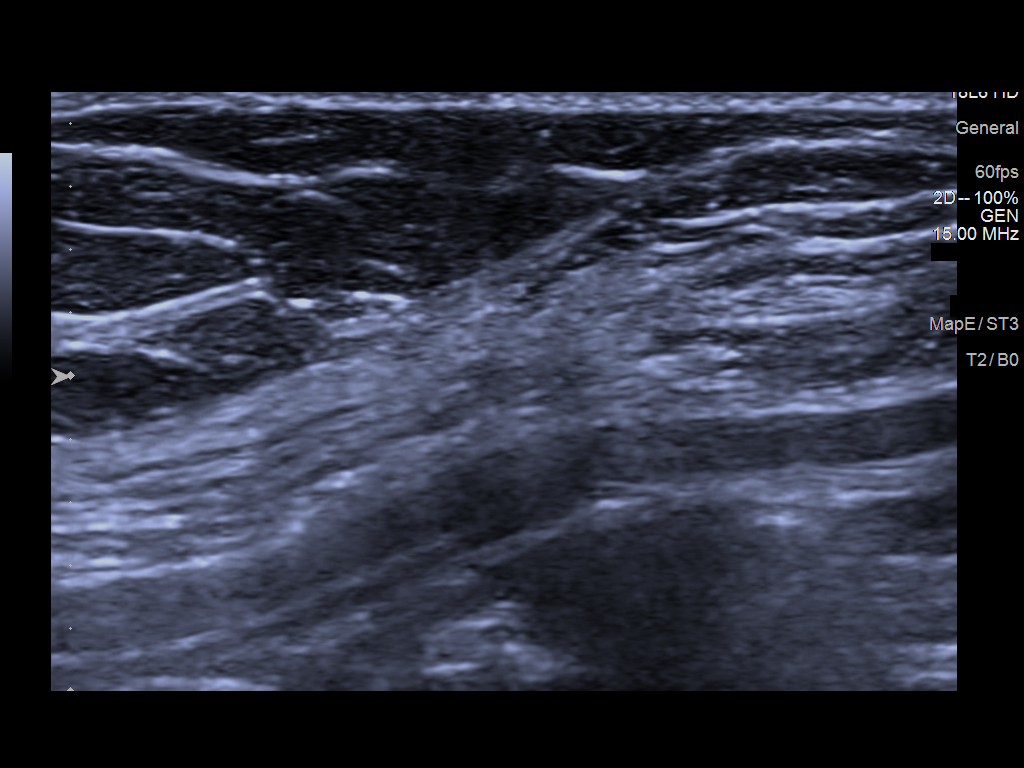
[im 3/4]
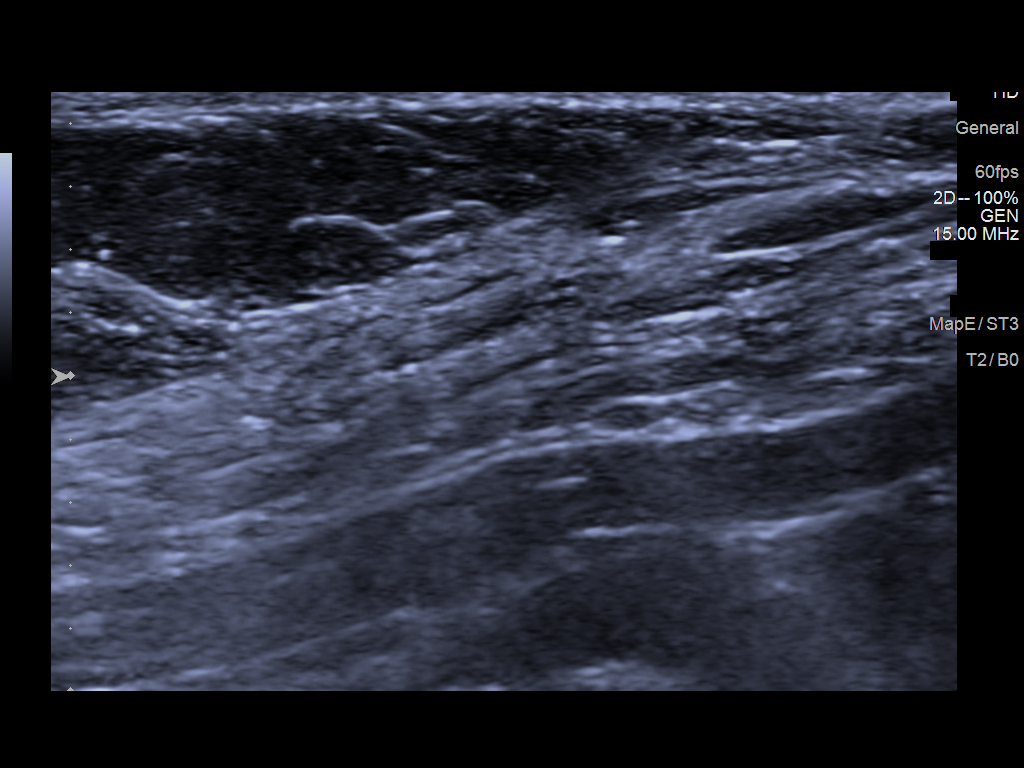
[im 4/4]
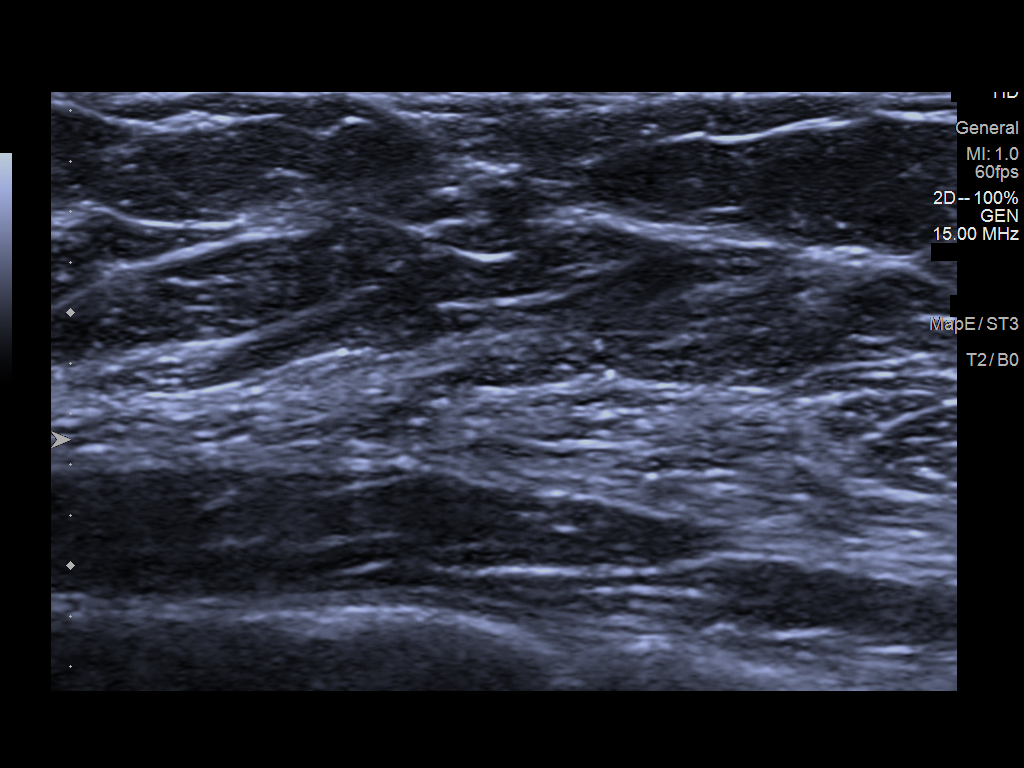

[4 of 4 positions shown; findings below may reference images not displayed]

ACR Breast Density Category c: The breast tissue is heterogeneously
dense, which may obscure small masses.
FINDINGS: The previously described possible mass in the right inner breast
persists on additional views as a 0.4 cm oval mass with indistinct
margins in the lower inner right breast at middle depth.

Targeted ultrasound of the lower inner right breast on the 3 -6
o'clock positions was performed by both the sonographer and the
physician. No sonographic correlate for the mammographic finding is
identified.
IMPRESSION: New 0.4 cm mass at the lower inner right breast at middle depth
without sonographic correlate. Recommend stereotactic guided biopsy.

RECOMMENDATION:
Stereotactic guided biopsy of the right breast.

I have discussed the findings and recommendations with the patient.
If applicable, a reminder letter will be sent to the patient
regarding the next appointment.

BI-RADS CATEGORY  4: Suspicious.

## 2022-08-29 ENCOUNTER — Ambulatory Visit (INDEPENDENT_AMBULATORY_CARE_PROVIDER_SITE_OTHER): Payer: Managed Care, Other (non HMO) | Admitting: Family Medicine

## 2022-08-29 ENCOUNTER — Encounter: Payer: Self-pay | Admitting: Family Medicine

## 2022-08-29 VITALS — BP 138/86 | HR 74 | Resp 18 | Ht 62.0 in | Wt 189.0 lb

## 2022-08-29 DIAGNOSIS — F411 Generalized anxiety disorder: Secondary | ICD-10-CM | POA: Diagnosis not present

## 2022-08-29 DIAGNOSIS — F988 Other specified behavioral and emotional disorders with onset usually occurring in childhood and adolescence: Secondary | ICD-10-CM

## 2022-08-29 MED ORDER — LISDEXAMFETAMINE DIMESYLATE 50 MG PO CAPS: 50.0000 mg | ORAL_CAPSULE | Freq: Every day | ORAL | 0 refills | Status: DC

## 2022-08-29 MED ORDER — SERTRALINE HCL 25 MG PO TABS: 25.0000 mg | ORAL_TABLET | Freq: Every day | ORAL | 2 refills | Status: DC

## 2022-08-29 NOTE — Progress Notes (Signed)
Established Patient Office Visit  Subjective   Patient ID: Jennifer Branch, female    DOB: Sep 03, 1965  Age: 57 y.o. MRN: [id number]  Chief Complaint  Patient presents with   Anxiety   Depression    HPI Jennifer Branch is a 57 y.o. female presenting today for follow up of mood. Mood: Patient is here to follow up for anxiety, was previously taking sertraline but weaned herself off of that.  Since that time, Jennifer Branch has noticed an increase in Jennifer Branch anxiety.     08/29/2022    1:30 PM 02/08/2022    2:59 PM 10/18/2021    2:46 PM  Depression screen PHQ 2/9  Decreased Interest 0 0 0  Down, Depressed, Hopeless 0 0 0  PHQ - 2 Score 0 0 0  Altered sleeping 0 0 0  Tired, decreased energy 0 0 0  Change in appetite 0 0 0  Feeling bad or failure about yourself  0 0 0  Trouble concentrating 0 0 0  Moving slowly or fidgety/restless 0 0 0  Suicidal thoughts 0 0 0  PHQ-9 Score 0 0 0  Difficult doing work/chores Not difficult at all         08/29/2022    1:30 PM 02/08/2022    2:59 PM 10/18/2021    2:46 PM 09/06/2021   10:49 AM  GAD 7 : Generalized Anxiety Score  Nervous, Anxious, on Edge 1 0 0 1  Control/stop worrying 0 0 0 1  Worry too much - different things 0 0 0 1  Trouble relaxing 1 0 0 0  Restless 0 0 0 0  Easily annoyed or irritable 1 0 0 1  Afraid - awful might happen 0 0 0 0  Total GAD 7 Score 3 0 0 4  Anxiety Difficulty Not difficult at all      ROS Negative unless otherwise noted in HPI   Objective:     BP 138/86 (BP Location: Left Arm, Patient Position: Sitting, Cuff Size: Large)   Pulse 74   Resp 18   Ht 5\' 2"  (1.575 m)   Wt 189 lb (85.7 kg)   SpO2 99%   BMI 34.57 kg/m   Physical Exam Constitutional:      General: Jennifer Branch is not in acute distress.    Appearance: Normal appearance.  HENT:     Head: Normocephalic and atraumatic.  Cardiovascular:     Rate and Rhythm: Normal rate and regular rhythm.     Pulses: Normal pulses.     Heart sounds: No murmur heard.    No  friction rub. No gallop.  Pulmonary:     Effort: Pulmonary effort is normal. No respiratory distress.     Breath sounds: No wheezing, rhonchi or rales.  Skin:    General: Skin is warm and dry.  Neurological:     Mental Status: Jennifer Branch is alert and oriented to person, place, and time.     Assessment & Plan:  Generalized anxiety disorder Assessment & Plan: Restart sertraline 25 mg daily.  Follow-up in 6 weeks to assess efficacy.  Can increase to 50 mg daily if needed.  I am hopeful that this will also help to decrease blood pressure.  Orders: -     Sertraline HCl; Take 1 tablet (25 mg total) by mouth daily.  Dispense: 30 tablet; Refill: 2  Adult attention deficit disorder Assessment & Plan: Continue Vyvanse 50 mg daily.  We discussed that this dosage could potentially be contributing to  higher blood pressure and/or anxiety, but we will start sertraline and reevaluate at 6-week follow-up.  I am hopeful that getting the anxiety under better control will decrease blood pressure as well.  Orders: -     Lisdexamfetamine Dimesylate; Take 1 capsule (50 mg total) by mouth daily.  Dispense: 30 capsule; Refill: 0 -     Lisdexamfetamine Dimesylate; Take 1 capsule (50 mg total) by mouth daily.  Dispense: 30 capsule; Refill: 0 -     Lisdexamfetamine Dimesylate; Take 1 capsule (50 mg total) by mouth daily.  Dispense: 30 capsule; Refill: 0    Return in about 6 weeks (around 10/10/2022) for follow-up for anxiety, restarting sertraline 25 mg daily.    [name], PA

## 2022-08-29 NOTE — Assessment & Plan Note (Signed)
Continue Vyvanse 50 mg daily.  We discussed that this dosage could potentially be contributing to higher blood pressure and/or anxiety, but we will start sertraline and reevaluate at 6-week follow-up.  I am hopeful that getting the anxiety under better control will decrease blood pressure as well.

## 2022-08-29 NOTE — Assessment & Plan Note (Signed)
Restart sertraline 25 mg daily.  Follow-up in 6 weeks to assess efficacy.  Can increase to 50 mg daily if needed.  I am hopeful that this will also help to decrease blood pressure.

## 2022-09-29 ENCOUNTER — Other Ambulatory Visit: Payer: Self-pay | Admitting: Family Medicine

## 2022-09-29 DIAGNOSIS — F988 Other specified behavioral and emotional disorders with onset usually occurring in childhood and adolescence: Secondary | ICD-10-CM

## 2022-09-30 MED ORDER — LISDEXAMFETAMINE DIMESYLATE 50 MG PO CAPS: 50.0000 mg | ORAL_CAPSULE | Freq: Every day | ORAL | 0 refills | Status: DC

## 2022-10-10 ENCOUNTER — Encounter: Payer: Self-pay | Admitting: Nurse Practitioner

## 2022-10-10 ENCOUNTER — Ambulatory Visit (INDEPENDENT_AMBULATORY_CARE_PROVIDER_SITE_OTHER): Payer: Managed Care, Other (non HMO) | Admitting: Nurse Practitioner

## 2022-10-10 VITALS — BP 152/84 | HR 66 | Ht 62.0 in | Wt 189.0 lb

## 2022-10-10 DIAGNOSIS — J300 Vasomotor rhinitis: Secondary | ICD-10-CM

## 2022-10-10 DIAGNOSIS — F988 Other specified behavioral and emotional disorders with onset usually occurring in childhood and adolescence: Secondary | ICD-10-CM

## 2022-10-10 DIAGNOSIS — J301 Allergic rhinitis due to pollen: Secondary | ICD-10-CM

## 2022-10-10 MED ORDER — FLUTICASONE PROPIONATE 50 MCG/ACT NA SUSP: 2.0000 | Freq: Every day | NASAL | 6 refills | Status: DC

## 2022-10-10 NOTE — Progress Notes (Signed)
Established patient visit   Patient: Jennifer Branch   DOB: 1965/10/24   57 y.o. Female  MRN: [id number] Visit Date: 10/10/2022   Chief Complaint  Patient presents with   Medical Management of Chronic Issues   Subjective    Sinus Problem This is a new problem. The current episode started in the past 7 days. The problem is unchanged. There has been no fever. Associated symptoms include congestion, ear pain, headaches, sinus pressure, sneezing and swollen glands. Treatments tried: NSAIDs which did help. The treatment provided moderate relief.    Follow up  -Refilled vyvanse today -weaned herself off of sertraline over last few months  -doing well . -no new concerns or complaints today  -She denies chest pain, chest pressure, or shortness of breath. She denies headaches or visual disturbances. She denies abdominal pain, nausea, vomiting, or changes in bowel or bladder habits.    Medications: Outpatient Medications Prior to Visit  Medication Sig   lisdexamfetamine (VYVANSE) 50 MG capsule Take 1 capsule (50 mg total) by mouth daily.   [START ON 10/24/2022] lisdexamfetamine (VYVANSE) 50 MG capsule Take 1 capsule (50 mg total) by mouth daily.   lisdexamfetamine (VYVANSE) 50 MG capsule Take 1 capsule (50 mg total) by mouth daily.   MAGNESIUM CITRATE PO Take 55 mg by mouth daily at 6 (six) AM.   progesterone (PROMETRIUM) 100 MG capsule Take 100 mg by mouth daily.   sertraline (ZOLOFT) 25 MG tablet Take 1 tablet (25 mg total) by mouth daily.   Testosterone 75 MG PLLT 75 mg by Implant route every 30 (thirty) days. Actually every 3 months   Facility-Administered Medications Prior to Visit  Medication Dose Route Frequency Provider   betamethasone acetate-betamethasone sodium phosphate (CELESTONE) injection 3 mg  3 mg Intramuscular Once Gala Lewandowsky M, DPM   betamethasone acetate-betamethasone sodium phosphate (CELESTONE) injection 3 mg  3 mg Intramuscular Once Felecia Shelling, DPM    Review of  Systems  HENT:  Positive for congestion, ear pain, sinus pressure and sneezing.   Neurological:  Positive for headaches.       Objective     Today's Vitals   10/10/22 1057 10/10/22 1140  BP: (Abnormal) 140/79 (Abnormal) 152/84  Pulse: 66   SpO2: 98%   Weight: 189 lb (85.7 kg)   Height: 5\' 2"  (1.575 m)    Body mass index is 34.57 kg/m.  BP Readings from Last 3 Encounters:  10/10/22 (Abnormal) 152/84  08/29/22 138/86  06/02/22 (Abnormal) 111/52    Wt Readings from Last 3 Encounters:  10/10/22 189 lb (85.7 kg)  08/29/22 189 lb (85.7 kg)  06/02/22 179 lb 15.7 oz (81.6 kg)    Physical Exam Vitals and nursing note reviewed.  Constitutional:      Appearance: Normal appearance. She is well-developed.  HENT:     Head: Normocephalic and atraumatic.     Right Ear: Tympanic membrane, ear canal and external ear normal.     Left Ear: Tympanic membrane, ear canal and external ear normal.     Nose: Nose normal.     Mouth/Throat:     Mouth: Mucous membranes are moist.     Pharynx: Oropharynx is clear.  Eyes:     Extraocular Movements: Extraocular movements intact.     Conjunctiva/sclera: Conjunctivae normal.     Pupils: Pupils are equal, round, and reactive to light.  Neck:     Vascular: No carotid bruit.  Cardiovascular:     Rate and Rhythm: Normal rate and  regular rhythm.     Pulses: Normal pulses.     Heart sounds: Normal heart sounds.  Pulmonary:     Effort: Pulmonary effort is normal.     Breath sounds: Normal breath sounds.  Abdominal:     Palpations: Abdomen is soft.  Musculoskeletal:        General: Normal range of motion.     Cervical back: Normal range of motion and neck supple.  Lymphadenopathy:     Cervical: No cervical adenopathy.  Skin:    General: Skin is warm and dry.     Capillary Refill: Capillary refill takes less than 2 seconds.  Neurological:     General: No focal deficit present.     Mental Status: She is alert and oriented to person, place,  and time.  Psychiatric:        Mood and Affect: Mood normal.        Behavior: Behavior normal.        Thought Content: Thought content normal.        Judgment: Judgment normal.      Assessment & Plan    Vasomotor rhinitis Assessment & Plan: Add Flonase nasal spray.  Use 2 sprays in both nostrils daily. Recommend use of Nettie pot as indicated.  Orders: -     Fluticasone Propionate; Place 2 sprays into both nostrils daily.  Dispense: 16 g; Refill: 6  Seasonal allergic rhinitis due to pollen Assessment & Plan: Recommend OTC treatment with Zyrtec or Claritin daily.   Adult attention deficit disorder Assessment & Plan: Continue Vyvanse 50 mg daily.   Anxiety has improved Blood pressure stable. Three, 38-day prescriptions for Vyvanse 50 mg sent to her pharmacy today.       Return in about 2 months (around 12/10/2022) for mood, health maintenance exam.         Carlean Jews, NP  [facility] [phone] (phone) [phone] (fax)  [facility]

## 2022-10-17 DIAGNOSIS — J300 Vasomotor rhinitis: Secondary | ICD-10-CM | POA: Insufficient documentation

## 2022-10-17 NOTE — Assessment & Plan Note (Signed)
Recommend OTC treatment with Zyrtec or Claritin daily.

## 2022-10-17 NOTE — Assessment & Plan Note (Signed)
Add Flonase nasal spray.  Use 2 sprays in both nostrils daily. Recommend use of Nettie pot as indicated.

## 2022-10-17 NOTE — Assessment & Plan Note (Addendum)
Continue Vyvanse 50 mg daily.   Anxiety has improved Blood pressure stable. Two, 38-day prescriptions for Vyvanse 50 mg sent to her pharmacy today.

## 2022-10-25 ENCOUNTER — Other Ambulatory Visit: Payer: Self-pay

## 2022-10-25 DIAGNOSIS — F411 Generalized anxiety disorder: Secondary | ICD-10-CM

## 2022-10-25 MED ORDER — SERTRALINE HCL 25 MG PO TABS: 25.0000 mg | ORAL_TABLET | Freq: Every day | ORAL | 0 refills | Status: DC

## 2022-11-03 ENCOUNTER — Ambulatory Visit (INDEPENDENT_AMBULATORY_CARE_PROVIDER_SITE_OTHER): Payer: Managed Care, Other (non HMO) | Admitting: Family Medicine

## 2022-11-03 ENCOUNTER — Ambulatory Visit: Payer: Managed Care, Other (non HMO)

## 2022-11-03 DIAGNOSIS — R300 Dysuria: Secondary | ICD-10-CM | POA: Diagnosis not present

## 2022-11-03 DIAGNOSIS — R319 Hematuria, unspecified: Secondary | ICD-10-CM | POA: Diagnosis not present

## 2022-11-03 DIAGNOSIS — R82998 Other abnormal findings in urine: Secondary | ICD-10-CM | POA: Diagnosis not present

## 2022-11-03 LAB — POCT URINALYSIS DIP (CLINITEK)
Bilirubin, UA: NEGATIVE
Glucose, UA: NEGATIVE mg/dL
Ketones, POC UA: NEGATIVE mg/dL
Nitrite, UA: POSITIVE — AB
POC PROTEIN,UA: 100 — AB
Spec Grav, UA: 1.02 (ref 1.010–1.025)
Urobilinogen, UA: 0.2 E.U./dL
pH, UA: 7 (ref 5.0–8.0)

## 2022-11-03 MED ORDER — NITROFURANTOIN MONOHYD MACRO 100 MG PO CAPS: 100.0000 mg | ORAL_CAPSULE | Freq: Two times a day (BID) | ORAL | 0 refills | Status: AC

## 2022-11-03 NOTE — Progress Notes (Signed)
Patient states that her symptoms started on 10/31/22 of vaginal itching, increased frequency with burning and pressure. States she took Azo which gave minimal results.   Patient has been informed of lab results as well as treatment recommendation. Patient verbalized understanding. All questions and concerns have been addressed.

## 2022-11-03 NOTE — Progress Notes (Signed)
Results for orders placed or performed in visit on 11/03/22  POCT URINALYSIS DIP (CLINITEK)  Result Value Ref Range   Color, UA brown (A) yellow   Clarity, UA cloudy (A) clear   Glucose, UA negative negative mg/dL   Bilirubin, UA negative negative   Ketones, POC UA negative negative mg/dL   Spec Grav, UA 1.610 9.604 - 1.025   Blood, UA large (A) negative   pH, UA 7.0 5.0 - 8.0   POC PROTEIN,UA =100 (A) negative, trace   Urobilinogen, UA 0.2 0.2 or 1.0 E.U./dL   Nitrite, UA Positive (A) Negative   Leukocytes, UA Large (3+) (A) Negative   Urinalysis with evidence of UTI.  Sending 5-day course of Macrobid, will change if necessary as indicated by urine culture results.Marland Kitchen

## 2022-11-08 LAB — URINE CULTURE

## 2022-12-06 ENCOUNTER — Telehealth: Payer: Self-pay | Admitting: *Deleted

## 2022-12-06 ENCOUNTER — Ambulatory Visit: Payer: Managed Care, Other (non HMO)

## 2022-12-06 NOTE — Telephone Encounter (Signed)
I would recommend that she stop by for a lab visit to provide a urine sample.  I would like to check urinalysis and send a urine culture if positive, and I would also like to test for candidiasis to ensure that we will not need to treat for that as well.

## 2022-12-06 NOTE — Telephone Encounter (Signed)
Pt calling stating that she believes that she may have another uti, she said symptoms started Sunday like menstrual cramps and then developed into itchy, burning, and frequency.  She would like to know if she needs to come back in for another appointment or what advice you can give her. Please advise.

## 2022-12-07 ENCOUNTER — Ambulatory Visit: Payer: Managed Care, Other (non HMO)

## 2022-12-07 ENCOUNTER — Ambulatory Visit (INDEPENDENT_AMBULATORY_CARE_PROVIDER_SITE_OTHER): Payer: Managed Care, Other (non HMO) | Admitting: Family Medicine

## 2022-12-07 DIAGNOSIS — R82998 Other abnormal findings in urine: Secondary | ICD-10-CM

## 2022-12-07 DIAGNOSIS — R300 Dysuria: Secondary | ICD-10-CM | POA: Diagnosis not present

## 2022-12-07 LAB — POCT URINALYSIS DIP (CLINITEK)
Bilirubin, UA: NEGATIVE
Blood, UA: NEGATIVE
Glucose, UA: NEGATIVE mg/dL
Ketones, POC UA: NEGATIVE mg/dL
Nitrite, UA: NEGATIVE
POC PROTEIN,UA: NEGATIVE
Spec Grav, UA: 1.01 (ref 1.010–1.025)
Urobilinogen, UA: 0.2 E.U./dL
pH, UA: 5.5 (ref 5.0–8.0)

## 2022-12-07 MED ORDER — NITROFURANTOIN MONOHYD MACRO 100 MG PO CAPS: 100.0000 mg | ORAL_CAPSULE | Freq: Two times a day (BID) | ORAL | 0 refills | Status: AC

## 2022-12-07 NOTE — Telephone Encounter (Signed)
Pt contacted and nurse visit scheduled per provider.

## 2022-12-07 NOTE — Progress Notes (Signed)
SUBJECTIVE: Jennifer Branch is a 57 y.o. female who complains of urinary frequency, urgency and dysuria x 5 days, without flank pain, fever, chills, or abnormal vaginal discharge or bleeding.    Urine dipstick shows positive for leukocytes.

## 2022-12-07 NOTE — Addendum Note (Signed)
Addended by: Saralyn Pilar on: 12/07/2022 04:55 PM   Modules accepted: Orders

## 2022-12-09 ENCOUNTER — Encounter: Payer: Self-pay | Admitting: Family Medicine

## 2022-12-09 DIAGNOSIS — B3731 Acute candidiasis of vulva and vagina: Secondary | ICD-10-CM

## 2022-12-09 DIAGNOSIS — F988 Other specified behavioral and emotional disorders with onset usually occurring in childhood and adolescence: Secondary | ICD-10-CM

## 2022-12-09 LAB — URINE CULTURE

## 2022-12-09 MED ORDER — LISDEXAMFETAMINE DIMESYLATE 50 MG PO CAPS: 50.0000 mg | ORAL_CAPSULE | Freq: Every day | ORAL | 0 refills | Status: DC

## 2022-12-12 MED ORDER — FLUCONAZOLE 150 MG PO TABS: 150.0000 mg | ORAL_TABLET | Freq: Once | ORAL | 0 refills | Status: AC

## 2023-01-12 ENCOUNTER — Telehealth: Payer: Self-pay

## 2023-01-12 ENCOUNTER — Other Ambulatory Visit: Payer: Self-pay | Admitting: Family Medicine

## 2023-01-12 DIAGNOSIS — F988 Other specified behavioral and emotional disorders with onset usually occurring in childhood and adolescence: Secondary | ICD-10-CM

## 2023-01-12 MED ORDER — LISDEXAMFETAMINE DIMESYLATE 50 MG PO CAPS: 50.0000 mg | ORAL_CAPSULE | Freq: Every day | ORAL | 0 refills | Status: DC

## 2023-01-12 NOTE — Telephone Encounter (Signed)
Meds ordered this encounter  Medications   lisdexamfetamine (VYVANSE) 50 MG capsule    Sig: Take 1 capsule (50 mg total) by mouth daily.    Dispense:  30 capsule    Refill:  0    Order Specific Question:   Supervising Provider    Answer:   Nani Gasser D [[id number]]

## 2023-01-12 NOTE — Telephone Encounter (Signed)
Prescription Request  01/12/2023  LOV: 12/07/22  What is the name of the medication or equipment?  lisdexamfetamine (VYVANSE) 50 MG capsule   Have you contacted your pharmacy to request a refill? No   Which pharmacy would you like this sent to?   [facility] #[id number] [place], [place] - [address] AT [facility] OF [address] [place] [address] Phone: [phone] Fax: [phone]   Patient notified that their request is being sent to the clinical staff for review and that they should receive a response within 2 business days.   Please advise at Mobile [phone] (mobile)

## 2023-01-22 ENCOUNTER — Other Ambulatory Visit: Payer: Self-pay | Admitting: Family Medicine

## 2023-01-22 DIAGNOSIS — F411 Generalized anxiety disorder: Secondary | ICD-10-CM

## 2023-02-08 ENCOUNTER — Ambulatory Visit (INDEPENDENT_AMBULATORY_CARE_PROVIDER_SITE_OTHER): Payer: Managed Care, Other (non HMO) | Admitting: Family Medicine

## 2023-02-08 VITALS — BP 145/73 | HR 83 | Resp 18 | Ht 62.0 in | Wt 183.0 lb

## 2023-02-08 DIAGNOSIS — Z6833 Body mass index (BMI) 33.0-33.9, adult: Secondary | ICD-10-CM

## 2023-02-08 DIAGNOSIS — E66811 Obesity, class 1: Secondary | ICD-10-CM

## 2023-02-08 DIAGNOSIS — Z23 Encounter for immunization: Secondary | ICD-10-CM | POA: Diagnosis not present

## 2023-02-08 DIAGNOSIS — F988 Other specified behavioral and emotional disorders with onset usually occurring in childhood and adolescence: Secondary | ICD-10-CM

## 2023-02-08 DIAGNOSIS — Z1159 Encounter for screening for other viral diseases: Secondary | ICD-10-CM | POA: Diagnosis not present

## 2023-02-08 MED ORDER — LISDEXAMFETAMINE DIMESYLATE 30 MG PO CAPS: 30.0000 mg | ORAL_CAPSULE | Freq: Every day | ORAL | 0 refills | Status: DC

## 2023-02-08 NOTE — Assessment & Plan Note (Addendum)
Restart lower dose of Vyvanse 30 mg daily in the morning.  Discussed that if this is wearing off in the afternoon too early, we may increase the morning dose, or add a short acting stimulant in the afternoon.  Patient verbalized understanding and is agreeable to this plan.  Continue ambulatory blood pressure monitoring to ensure that blood pressure remains within the normal range.  Will continue to monitor.

## 2023-02-08 NOTE — Patient Instructions (Signed)
Let me know how the lower dose of Vyvanse works for you!

## 2023-02-08 NOTE — Progress Notes (Unsigned)
Established Patient Office Visit  Subjective   Patient ID: Jennifer Branch, female    DOB: 01-16-1966  Age: 57 y.o. MRN: [id number]  Chief Complaint  Patient presents with   ADD    HPI Jennifer Branch is a 57 y.o. female presenting today for follow up of ADHD. ADHD: Restarting Vyvanse but may want to start at a lower dose.  She cannot remember why her dose was increased to 50 mg daily in the past. Denies any problems with insomnia, chest pain, palpitations, or SOB.  When she checks her blood pressure at home or at other offices, it is within normal range <120/80.  Outpatient Medications Prior to Visit  Medication Sig   fluticasone (FLONASE) 50 MCG/ACT nasal spray Place 2 sprays into both nostrils daily.   MAGNESIUM CITRATE PO Take 55 mg by mouth daily at 6 (six) AM.   progesterone (PROMETRIUM) 100 MG capsule Take 100 mg by mouth daily.   sertraline (ZOLOFT) 25 MG tablet TAKE 1 TABLET(25 MG) BY MOUTH DAILY   Testosterone 75 MG PLLT 75 mg by Implant route every 30 (thirty) days. Actually every 3 months   [DISCONTINUED] lisdexamfetamine (VYVANSE) 50 MG capsule Take 1 capsule (50 mg total) by mouth daily.   Facility-Administered Medications Prior to Visit  Medication Dose Route Frequency Provider   betamethasone acetate-betamethasone sodium phosphate (CELESTONE) injection 3 mg  3 mg Intramuscular Once Jennifer Branch Branch, DPM   betamethasone acetate-betamethasone sodium phosphate (CELESTONE) injection 3 mg  3 mg Intramuscular Once Jennifer Branch, DPM    ROS Negative unless otherwise noted in HPI   Objective:     BP (!) 145/73 (BP Location: Left Arm, Patient Position: Sitting, Cuff Size: Normal)   Pulse 83   Resp 18   Ht 5\' 2"  (1.575 Branch)   Wt 183 lb (83 kg)   SpO2 99%   BMI 33.47 kg/Branch   Physical Exam Constitutional:      General: She is not in acute distress.    Appearance: Normal appearance.  HENT:     Head: Normocephalic and atraumatic.  Cardiovascular:     Rate and  Rhythm: Normal rate and regular rhythm.     Heart sounds: No murmur heard.    No friction rub. No gallop.  Pulmonary:     Effort: Pulmonary effort is normal. No respiratory distress.     Breath sounds: No wheezing, rhonchi or rales.  Skin:    General: Skin is warm and dry.  Neurological:     Mental Status: She is alert and oriented to person, place, and time.     Assessment & Plan:  Adult attention deficit disorder Assessment & Plan: Restart lower dose of Vyvanse 30 mg daily in the morning.  Discussed that if this is wearing off in the afternoon too early, we may increase the morning dose, or add a short acting stimulant in the afternoon.  Patient verbalized understanding and is agreeable to this plan.  Continue ambulatory blood pressure monitoring to ensure that blood pressure remains within the normal range.  Will continue to monitor.  Orders: -     Lisdexamfetamine Dimesylate; Take 1 capsule (30 mg total) by mouth daily.  Dispense: 30 capsule; Refill: 0  Need for influenza vaccination -     Flu vaccine trivalent PF, 6mos and older(Flulaval,Afluria,Fluarix,Fluzone)  Screening for viral disease -     Hepatitis C antibody; Future -     HIV Antibody (routine testing w rflx); Future  Class 1  obesity without serious comorbidity with body mass index (BMI) of 33.0 to 33.9 in adult, unspecified obesity type -     CBC with Differential/Platelet; Future -     Comprehensive metabolic panel; Future -     Hemoglobin A1c; Future -     Lipid panel; Future -     TSH Rfx on Abnormal to Free T4; Future -     VITAMIN D 25 Hydroxy (Vit-D Deficiency, Fractures); Future    Return in about 3 months (around 05/11/2023) for annual physical, fasting blood work 1 week before.    Jennifer Quitter, PA

## 2023-03-14 ENCOUNTER — Telehealth: Payer: Self-pay

## 2023-03-14 DIAGNOSIS — F988 Other specified behavioral and emotional disorders with onset usually occurring in childhood and adolescence: Secondary | ICD-10-CM

## 2023-03-14 MED ORDER — LISDEXAMFETAMINE DIMESYLATE 30 MG PO CAPS: 30.0000 mg | ORAL_CAPSULE | Freq: Every day | ORAL | 0 refills | Status: DC

## 2023-03-14 NOTE — Telephone Encounter (Signed)
Copied from CRM [id number]. Topic: Clinical - Medication Refill >> Mar 14, 2023  1:51 PM Louie Boston wrote: Most Recent Primary Care Visit:  Provider: Saralyn Pilar A  Department: [facility]  Visit Type: OFFICE VISIT 20  Date: 02/08/2023  Medication: lisdexamfetamine (VYVANSE) 30 MG capsule   Has the patient contacted their pharmacy? Yes (Agent: If no, request that the patient contact the pharmacy for the refill. If patient does not wish to contact the pharmacy document the reason why and proceed with request.) (Agent: If yes, when and what did the pharmacy advise?)   Patient was advised by pharmacy that she does not have anymore refills for this medication and to contact us to request refill.   Is this the correct pharmacy for this prescription? Yes If no, delete pharmacy and type the correct one.  This is the patient's preferred pharmacy:    [facility] DR[facility]ORE #[id number] [place], [place] - [address] AT [facility] OF [address] [place] [address] Phone: [phone] Fax: [phone]   Has the prescription been filled recently? Yes  Is the patient out of the medication? Yes  Has the patient been seen for an appointment in the last year OR does the patient have an upcoming appointment? Yes  Can we respond through MyChart? Yes  Agent: Please be advised that Rx refills may take up to 3 business days. We ask that you follow-up with your pharmacy.

## 2023-03-14 NOTE — Telephone Encounter (Signed)
Copied from CRM [id number]. Topic: Clinical - Medication Refill >> Mar 14, 2023  1:51 PM Louie Boston wrote: Most Recent Primary Care Visit:  Provider: Saralyn Pilar A  Department: [facility]  Visit Type: OFFICE VISIT 20  Date: 02/08/2023  Medication: lisdexamfetamine (VYVANSE) 30 MG capsule   Has the patient contacted their pharmacy? Yes (Agent: If no, request that the patient contact the pharmacy for the refill. If patient does not wish to contact the pharmacy document the reason why and proceed with request.) (Agent: If yes, when and what did the pharmacy advise?)   Patient was advised by pharmacy that she does not have anymore refills for this medication and to contact us to request refill.   Is this the correct pharmacy for this prescription? Yes If no, delete pharmacy and type the correct one.  This is the patient's preferred pharmacy:    [facility] DR[facility] STORE #[id number] [place], [place] - [address] AT [facility] [address] [place] [address] Phone: [phone] Fax: [phone]   Has the prescription been filled recently? Yes  Is the patient out of the medication? Yes  Has the patient been seen for an appointment in the last year OR does the patient have an upcoming appointment? Yes  Can we respond through MyChart? Yes  Agent: Please be advised that Rx refills may take up to 3 business days. We ask that you follow-up with your pharmacy.

## 2023-03-14 NOTE — Telephone Encounter (Signed)
Meds ordered this encounter  Medications   lisdexamfetamine (VYVANSE) 30 MG capsule    Sig: Take 1 capsule (30 mg total) by mouth daily.    Dispense:  30 capsule    Refill:  0    Order Specific Question:   Supervising Provider    Answer:   Sandre Kitty [[id number]]   lisdexamfetamine (VYVANSE) 30 MG capsule    Sig: Take 1 capsule (30 mg total) by mouth daily.    Dispense:  30 capsule    Refill:  0    Last fill before appt    Order Specific Question:   Supervising Provider    Answer:   Sandre Kitty [[id number]]   Prescriptions for next 2 months sent

## 2023-03-14 NOTE — Addendum Note (Signed)
Addended by: Saralyn Pilar on: 03/14/2023 05:18 PM   Modules accepted: Orders

## 2023-05-04 ENCOUNTER — Other Ambulatory Visit (INDEPENDENT_AMBULATORY_CARE_PROVIDER_SITE_OTHER): Payer: Managed Care, Other (non HMO) | Admitting: Family Medicine

## 2023-05-04 ENCOUNTER — Other Ambulatory Visit: Payer: Managed Care, Other (non HMO)

## 2023-05-04 DIAGNOSIS — Z6833 Body mass index (BMI) 33.0-33.9, adult: Secondary | ICD-10-CM

## 2023-05-04 DIAGNOSIS — R300 Dysuria: Secondary | ICD-10-CM

## 2023-05-04 DIAGNOSIS — Z1159 Encounter for screening for other viral diseases: Secondary | ICD-10-CM

## 2023-05-04 LAB — POCT URINALYSIS DIPSTICK
Bilirubin, UA: NEGATIVE
Blood, UA: NEGATIVE
Glucose, UA: NEGATIVE
Ketones, UA: NEGATIVE
Leukocytes, UA: NEGATIVE
Nitrite, UA: NEGATIVE
Protein, UA: NEGATIVE
Spec Grav, UA: 1.01 (ref 1.010–1.025)
Urobilinogen, UA: 0.2 U/dL
pH, UA: 7 (ref 5.0–8.0)

## 2023-05-05 LAB — LIPID PANEL
Chol/HDL Ratio: 2.3 {ratio} (ref 0.0–4.4)
Cholesterol, Total: 227 mg/dL — ABNORMAL HIGH (ref 100–199)
HDL: 98 mg/dL (ref >39)
LDL Chol Calc (NIH): 117 mg/dL — ABNORMAL HIGH (ref 0–99)
Triglycerides: 70 mg/dL (ref 0–149)
VLDL Cholesterol Cal: 12 mg/dL (ref 5–40)

## 2023-05-05 LAB — COMPREHENSIVE METABOLIC PANEL
ALT: 47 [IU]/L — ABNORMAL HIGH (ref 0–32)
AST: 63 [IU]/L — ABNORMAL HIGH (ref 0–40)
Albumin: 4.7 g/dL (ref 3.8–4.9)
Alkaline Phosphatase: 53 [IU]/L (ref 44–121)
BUN/Creatinine Ratio: 20 (ref 9–23)
BUN: 19 mg/dL (ref 6–24)
Bilirubin Total: 0.5 mg/dL (ref 0.0–1.2)
CO2: 23 mmol/L (ref 20–29)
Calcium: 9.6 mg/dL (ref 8.7–10.2)
Chloride: 98 mmol/L (ref 96–106)
Creatinine, Ser: 0.97 mg/dL (ref 0.57–1.00)
Globulin, Total: 2.3 g/dL (ref 1.5–4.5)
Glucose: 70 mg/dL (ref 70–99)
Potassium: 4.6 mmol/L (ref 3.5–5.2)
Sodium: 137 mmol/L (ref 134–144)
Total Protein: 7 g/dL (ref 6.0–8.5)
eGFR: 68 mL/min/{1.73_m2} (ref >59)

## 2023-05-05 LAB — CBC WITH DIFFERENTIAL/PLATELET
Basophils Absolute: 0 10*3/uL (ref 0.0–0.2)
Basos: 0 %
EOS (ABSOLUTE): 0.1 10*3/uL (ref 0.0–0.4)
Eos: 2 %
Hematocrit: 39.4 % (ref 34.0–46.6)
Hemoglobin: 13 g/dL (ref 11.1–15.9)
Immature Grans (Abs): 0 10*3/uL (ref 0.0–0.1)
Immature Granulocytes: 0 %
Lymphocytes Absolute: 1.8 10*3/uL (ref 0.7–3.1)
Lymphs: 31 %
MCH: 29.7 pg (ref 26.6–33.0)
MCHC: 33 g/dL (ref 31.5–35.7)
MCV: 90 fL (ref 79–97)
Monocytes Absolute: 0.4 10*3/uL (ref 0.1–0.9)
Monocytes: 7 %
Neutrophils Absolute: 3.4 10*3/uL (ref 1.4–7.0)
Neutrophils: 60 %
Platelets: 261 10*3/uL (ref 150–450)
RBC: 4.37 x10E6/uL (ref 3.77–5.28)
RDW: 12.5 % (ref 11.7–15.4)
WBC: 5.7 10*3/uL (ref 3.4–10.8)

## 2023-05-05 LAB — HEMOGLOBIN A1C
Est. average glucose Bld gHb Est-mCnc: 108 mg/dL
Hgb A1c MFr Bld: 5.4 % (ref 4.8–5.6)

## 2023-05-05 LAB — HEPATITIS C ANTIBODY: Hep C Virus Ab: NONREACTIVE

## 2023-05-05 LAB — HIV ANTIBODY (ROUTINE TESTING W REFLEX): HIV Screen 4th Generation wRfx: NONREACTIVE

## 2023-05-05 LAB — TSH RFX ON ABNORMAL TO FREE T4: TSH: 0.918 u[IU]/mL (ref 0.450–4.500)

## 2023-05-05 LAB — VITAMIN D 25 HYDROXY (VIT D DEFICIENCY, FRACTURES): Vit D, 25-Hydroxy: 58.2 ng/mL (ref 30.0–100.0)

## 2023-05-11 ENCOUNTER — Encounter: Payer: Self-pay | Admitting: Family Medicine

## 2023-05-11 ENCOUNTER — Ambulatory Visit (INDEPENDENT_AMBULATORY_CARE_PROVIDER_SITE_OTHER): Payer: Managed Care, Other (non HMO) | Admitting: Family Medicine

## 2023-05-11 VITALS — BP 129/78 | HR 70 | Ht 62.0 in | Wt 180.0 lb

## 2023-05-11 DIAGNOSIS — F411 Generalized anxiety disorder: Secondary | ICD-10-CM

## 2023-05-11 DIAGNOSIS — Z0000 Encounter for general adult medical examination without abnormal findings: Secondary | ICD-10-CM | POA: Diagnosis not present

## 2023-05-11 DIAGNOSIS — R748 Abnormal levels of other serum enzymes: Secondary | ICD-10-CM | POA: Insufficient documentation

## 2023-05-11 DIAGNOSIS — F988 Other specified behavioral and emotional disorders with onset usually occurring in childhood and adolescence: Secondary | ICD-10-CM

## 2023-05-11 MED ORDER — SERTRALINE HCL 25 MG PO TABS: 25.0000 mg | ORAL_TABLET | Freq: Every day | ORAL | 0 refills | Status: DC

## 2023-05-11 MED ORDER — LISDEXAMFETAMINE DIMESYLATE 30 MG PO CAPS
30.0000 mg | ORAL_CAPSULE | Freq: Every day | ORAL | 0 refills | Status: DC
Start: 2023-05-11 — End: 2023-09-25

## 2023-05-11 MED ORDER — LISDEXAMFETAMINE DIMESYLATE 30 MG PO CAPS: 30.0000 mg | ORAL_CAPSULE | Freq: Every day | ORAL | 0 refills | Status: DC

## 2023-05-11 NOTE — Assessment & Plan Note (Signed)
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  Will continue to monitor closely.

## 2023-05-11 NOTE — Patient Instructions (Addendum)
PREVENTATIVE CARE: -Shingles vaccine: 2 dose series recommended for all adults starting at age 58.  We can do this at our office, or you can have it done at the pharmacy if that fits her work schedule better!

## 2023-05-11 NOTE — Progress Notes (Signed)
Complete physical exam  Patient: Jennifer Branch   DOB: 1965-11-28   58 y.o. Female  MRN: [id number]  Subjective:    Chief Complaint  Patient presents with   Annual Exam    Jennifer Branch is a 58 y.o. female who presents today for a complete physical exam. She reports consuming a  high-protein  diet.  She continues to stay active and has no switch to working fitness full-time 5 days a week.  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  She generally feels well. She reports sleeping well. She does not have additional problems to discuss today.    Most recent fall risk assessment:    08/29/2022    1:31 PM  Fall Risk   Falls in the past year? 0  Number falls in past yr: 0  Injury with Fall? 0  Risk for fall due to : No Fall Risks  Follow up Falls evaluation completed     Most recent depression and anxiety screenings:    05/11/2023    1:55 PM 10/10/2022   10:58 AM  PHQ 2/9 Scores  PHQ - 2 Score 0 0  PHQ- 9 Score 0 0      08/29/2022    1:30 PM 02/08/2022    2:59 PM 10/18/2021    2:46 PM 09/06/2021   10:49 AM  GAD 7 : Generalized Anxiety Score  Nervous, Anxious, on Edge 1 0 0 1  Control/stop worrying 0 0 0 1  Worry too much - different things 0 0 0 1  Trouble relaxing 1 0 0 0  Restless 0 0 0 0  Easily annoyed or irritable 1 0 0 1  Afraid - awful might happen 0 0 0 0  Total GAD 7 Score 3 0 0 4  Anxiety Difficulty Not difficult at all       Patient Active Problem List   Diagnosis Date Noted   Elevated liver enzymes 05/11/2023   Vasomotor rhinitis 10/17/2022   Left inguinal hernia 02/07/2021   Adult attention deficit disorder 10/08/2020   Generalized anxiety disorder 10/08/2020   Menopausal and perimenopausal disorder 04/02/2020   Body mass index (BMI) of 32.0-32.9 in adult 02/19/2020   Allergic rhinitis due to pollen 08/30/2017   Gastroesophageal reflux disease without esophagitis 08/30/2017   Recurrent major  depressive disorder, in full remission (HCC) 08/30/2017    Past Surgical History:  Procedure Laterality Date   BRACHIOPLASTY Left    BREAST BIOPSY Left 2011   neg   BREAST BIOPSY Right 12/01/2020   stereo bx of mass, path pending, coil marker   COLONOSCOPY WITH PROPOFOL N/A 10/11/2021   Procedure: COLONOSCOPY WITH PROPOFOL;  Surgeon: Midge Minium, MD;  Location: [facility];  Service: Endoscopy;  Laterality: N/A;   FOOT SURGERY Left    INNER THIGH LIFT     INSERTION OF MESH  06/02/2022   Procedure: INSERTION OF MESH;  Surgeon: Sung Amabile, DO;  Location: [facility];  Service: General;;   tummy tuck     Social History   Tobacco Use   Smoking status: Former    Passive exposure: Past   Smokeless tobacco: Never  Vaping Use   Vaping status: Never Used  Substance Use Topics   Alcohol use: Yes    Comment: occ.   Drug use: Never   Family History  Problem Relation Age of Onset   COPD Mother    Hypertension Father  Hodgkin's lymphoma Brother    Lung cancer Maternal Grandfather    Breast cancer Paternal Grandmother    Allergies  Allergen Reactions   Crab [Shellfish Allergy] Swelling and Other (See Comments)    Crab only no issues with any other shell fish happened when a child not certain of details   Penicillins     Does not recall reaction happened when she was a child      Patient Care Team: [name], PA as PCP - General (Family Medicine)   Outpatient Medications Prior to Visit  Medication Sig   CREATINE PO Take by mouth.   fluticasone (FLONASE) 50 MCG/ACT nasal spray Place 2 sprays into both nostrils daily.   MAGNESIUM CITRATE PO Take 55 mg by mouth daily at 6 (six) AM.   progesterone (PROMETRIUM) 100 MG capsule Take 100 mg by mouth daily.   Testosterone 75 MG PLLT 75 mg by Implant route every 30 (thirty) days. Actually every 3 months   [DISCONTINUED] lisdexamfetamine (VYVANSE) 30 MG capsule Take 1 capsule (30 mg total) by mouth daily.    [DISCONTINUED] lisdexamfetamine (VYVANSE) 30 MG capsule Take 1 capsule (30 mg total) by mouth daily.   [DISCONTINUED] sertraline (ZOLOFT) 25 MG tablet TAKE 1 TABLET(25 MG) BY MOUTH DAILY   [DISCONTINUED] betamethasone acetate-betamethasone sodium phosphate (CELESTONE) injection 3 mg    [DISCONTINUED] betamethasone acetate-betamethasone sodium phosphate (CELESTONE) injection 3 mg    No facility-administered medications prior to visit.    Review of Systems  Constitutional:  Negative for chills, fever and malaise/fatigue.  HENT:  Negative for congestion and hearing loss.   Eyes:  Negative for blurred vision and double vision.  Respiratory:  Negative for cough and shortness of breath.   Cardiovascular:  Negative for chest pain, palpitations and leg swelling.  Gastrointestinal:  Negative for abdominal pain, constipation, diarrhea and heartburn.  Genitourinary:  Negative for frequency and urgency.  Musculoskeletal:  Negative for myalgias and neck pain.  Neurological:  Negative for headaches.  Endo/Heme/Allergies:  Negative for polydipsia.  Psychiatric/Behavioral:  Negative for depression. The patient is not nervous/anxious and does not have insomnia.       Objective:    BP 129/78   Pulse 70   Ht 5\' 2"  (1.575 m)   Wt 180 lb (81.6 kg)   SpO2 99%   BMI 32.92 kg/m    Physical Exam Constitutional:      General: She is not in acute distress.    Appearance: Normal appearance.  HENT:     Head: Normocephalic and atraumatic.     Right Ear: Tympanic membrane, ear canal and external ear normal.     Left Ear: Tympanic membrane, ear canal and external ear normal.     Nose: Nose normal.     Mouth/Throat:     Mouth: Mucous membranes are moist.     Pharynx: No oropharyngeal exudate or posterior oropharyngeal erythema.  Eyes:     Extraocular Movements: Extraocular movements intact.     Conjunctiva/sclera: Conjunctivae normal.     Pupils: Pupils are equal, round, and reactive to light.      Comments: Wearing contacts  Neck:     Thyroid: No thyroid mass, thyromegaly or thyroid tenderness.  Cardiovascular:     Rate and Rhythm: Normal rate and regular rhythm.     Heart sounds: Normal heart sounds. No murmur heard.    No friction rub. No gallop.  Pulmonary:     Effort: Pulmonary effort is normal. No respiratory distress.  Breath sounds: Normal breath sounds. No wheezing, rhonchi or rales.  Abdominal:     General: Abdomen is flat. Bowel sounds are normal. There is no distension.     Palpations: There is no mass.     Tenderness: There is no abdominal tenderness. There is no guarding.  Musculoskeletal:        General: Normal range of motion.     Cervical back: Normal range of motion and neck supple.  Lymphadenopathy:     Cervical: No cervical adenopathy.  Skin:    General: Skin is warm and dry.  Neurological:     Mental Status: She is alert and oriented to person, place, and time.     Cranial Nerves: No cranial nerve deficit.     Motor: No weakness.     Deep Tendon Reflexes: Reflexes normal.  Psychiatric:        Mood and Affect: Mood normal.        Assessment & Plan:    Routine Health Maintenance and Physical Exam  Immunization History  Administered Date(s) Administered   Influenza Inj Mdck Quad Pf 02/12/2019, 01/31/2020   Influenza, Seasonal, Injecte, Preservative Fre 02/08/2023   Influenza,inj,Quad PF,6+ Mos 02/08/2022   Moderna Sars-Covid-2 Vaccination 09/05/2019, 10/15/2019   Tdap 02/08/2022    Health Maintenance  Topic Date Due   Zoster Vaccines- Shingrix (1 of 2) Never done   Cervical Cancer Screening (HPV/Pap Cotest)  01/24/2023   COVID-19 Vaccine (3 - 2024-25 season) 05/27/2023 (Originally 12/18/2022)   MAMMOGRAM  06/21/2024   Colonoscopy  10/12/2031   DTaP/Tdap/Td (2 - Td or Tdap) 02/09/2032   INFLUENZA VACCINE  Completed   Hepatitis C Screening  Completed   HIV Screening  Completed   HPV VACCINES  Aged Out    Reviewed most recent labs  including CBC, CMP, lipid panel, A1C, TSH, and vitamin D. All within normal limits/stable from last check other than LDL slightly elevated at 117 stable, AST and ALT now elevated at 63 and 47.  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  Will continue to monitor closely. Most recent Pap smear was in 2019 with [facility], she is agreeable to updating Pap smear with primary care in 6 months. Discussed shingles vaccination recommendations.  She is open to getting them but would like to wait until she has a more routine schedule so that she can plan for a day in case she does not feel well after getting it.  Discussed health benefits of physical activity, and encouraged her to engage in regular exercise appropriate for her age and condition.  Wellness examination  Adult attention deficit disorder -     Lisdexamfetamine Dimesylate; Take 1 capsule (30 mg total) by mouth daily.  Dispense: 30 capsule; Refill: 0 -     Lisdexamfetamine Dimesylate; Take 1 capsule (30 mg total) by mouth daily.  Dispense: 30 capsule; Refill: 0 -     Lisdexamfetamine Dimesylate; Take 1 capsule (30 mg total) by mouth daily.  Dispense: 30 capsule; Refill: 0  Generalized anxiety disorder -     Sertraline HCl; Take 1 tablet (25 mg total) by mouth daily.  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  Will continue to monitor closely.     Return in about 3 months (around 08/09/2023) for  follow-up for ADHD, in person or video; 6 months for pap, mood, shingles, fasting labs 1 wk prior.     [name], PA

## 2023-05-25 ENCOUNTER — Encounter: Payer: Self-pay | Admitting: Family Medicine

## 2023-06-19 DIAGNOSIS — N951 Menopausal and female climacteric states: Secondary | ICD-10-CM | POA: Diagnosis not present

## 2023-06-20 ENCOUNTER — Other Ambulatory Visit: Payer: Self-pay | Admitting: Family Medicine

## 2023-06-20 NOTE — Telephone Encounter (Signed)
 Copied from CRM [id number]. Topic: Clinical - Medication Refill >> Jun 20, 2023 11:51 AM Marland Kitchen D wrote: Most Recent Primary Care Visit:  Provider: Saralyn Pilar A  Department: [facility]  Visit Type: PHYSICAL  Date: 05/11/2023  Medication: lisdexamfetamine (VYVANSE) 30 MG capsule  Has the patient contacted their pharmacy? Yes, Patient got the automatic line that stated that she has no more refills (Agent: If no, request that the patient contact the pharmacy for the refill. If patient does not wish to contact the pharmacy document the reason why and proceed with request.) (Agent: If yes, when and what did the pharmacy advise?)  Is this the correct pharmacy for this prescription? Yes If no, delete pharmacy and type the correct one.  This is the patient's preferred pharmacy:  [facility] DR[facility]ORE #[id number] [place], [place] - [address] AT [facility] OF [address] [place] [address] Phone: [phone] Fax: [phone]    Has the prescription been filled recently? No  Is the patient out of the medication? Yes  Has the patient been seen for an appointment in the last year OR does the patient have an upcoming appointment? Yes  Can we respond through MyChart? Yes  Agent: Please be advised that Rx refills may take up to 3 business days. We ask that you follow-up with your pharmacy.

## 2023-06-22 DIAGNOSIS — R232 Flushing: Secondary | ICD-10-CM | POA: Diagnosis not present

## 2023-06-22 DIAGNOSIS — F32A Depression, unspecified: Secondary | ICD-10-CM | POA: Diagnosis not present

## 2023-06-22 DIAGNOSIS — N951 Menopausal and female climacteric states: Secondary | ICD-10-CM | POA: Diagnosis not present

## 2023-06-22 DIAGNOSIS — F419 Anxiety disorder, unspecified: Secondary | ICD-10-CM | POA: Diagnosis not present

## 2023-08-09 ENCOUNTER — Other Ambulatory Visit: Payer: Self-pay | Admitting: Family Medicine

## 2023-08-09 DIAGNOSIS — F411 Generalized anxiety disorder: Secondary | ICD-10-CM

## 2023-09-19 DIAGNOSIS — J019 Acute sinusitis, unspecified: Secondary | ICD-10-CM | POA: Diagnosis not present

## 2023-09-19 DIAGNOSIS — B9689 Other specified bacterial agents as the cause of diseases classified elsewhere: Secondary | ICD-10-CM | POA: Diagnosis not present

## 2023-09-25 ENCOUNTER — Encounter: Payer: Self-pay | Admitting: Internal Medicine

## 2023-09-25 ENCOUNTER — Ambulatory Visit (INDEPENDENT_AMBULATORY_CARE_PROVIDER_SITE_OTHER): Admitting: Internal Medicine

## 2023-09-25 VITALS — BP 140/80 | Ht 62.0 in | Wt 181.2 lb

## 2023-09-25 DIAGNOSIS — K219 Gastro-esophageal reflux disease without esophagitis: Secondary | ICD-10-CM | POA: Diagnosis not present

## 2023-09-25 DIAGNOSIS — F419 Anxiety disorder, unspecified: Secondary | ICD-10-CM

## 2023-09-25 DIAGNOSIS — F988 Other specified behavioral and emotional disorders with onset usually occurring in childhood and adolescence: Secondary | ICD-10-CM | POA: Diagnosis not present

## 2023-09-25 DIAGNOSIS — E66811 Obesity, class 1: Secondary | ICD-10-CM

## 2023-09-25 DIAGNOSIS — Z6833 Body mass index (BMI) 33.0-33.9, adult: Secondary | ICD-10-CM

## 2023-09-25 DIAGNOSIS — J019 Acute sinusitis, unspecified: Secondary | ICD-10-CM

## 2023-09-25 DIAGNOSIS — E6609 Other obesity due to excess calories: Secondary | ICD-10-CM | POA: Insufficient documentation

## 2023-09-25 DIAGNOSIS — I10 Essential (primary) hypertension: Secondary | ICD-10-CM | POA: Insufficient documentation

## 2023-09-25 DIAGNOSIS — B9689 Other specified bacterial agents as the cause of diseases classified elsewhere: Secondary | ICD-10-CM

## 2023-09-25 DIAGNOSIS — E7800 Pure hypercholesterolemia, unspecified: Secondary | ICD-10-CM | POA: Insufficient documentation

## 2023-09-25 DIAGNOSIS — F32A Depression, unspecified: Secondary | ICD-10-CM

## 2023-09-25 MED ORDER — PREDNISONE 10 MG PO TABS: ORAL_TABLET | ORAL | 0 refills | Status: DC

## 2023-09-25 NOTE — Assessment & Plan Note (Signed)
Will check lipid profile with annual exam Encouraged low fat diet

## 2023-09-25 NOTE — Assessment & Plan Note (Signed)
Stable off meds We will monitor

## 2023-09-25 NOTE — Assessment & Plan Note (Signed)
 Doing okay off vyvanse  We will monitor

## 2023-09-25 NOTE — Progress Notes (Signed)
 Subjective:    Patient ID: Jennifer Branch, female    DOB: 03-Feb-1966, 58 y.o.   MRN: [id number]  HPI  Patient presents to the clinic today to establish care and for management of the conditions listed below.  ADHD: She reports mainly inattention.  She is no longer taking vyvanse .  She does not follow with psychiatry.  Anxiety and depression: Situational, she is not taking any medication for this at this time, but she used be on sertraline  in the past..  She is not currently seeing a therapist.  She denies SI/HI.  GERD: Triggered by weight.  She is not taking any medications for this.  There is no upper GI on file.  HLD: Her last LDL was 117, triglycerides 70, 04/2023.  She is not taking any cholesterol-lowering medication this time.  She tries to consume low-fat diet.  HTN: her BP today is 148/82.  She is not taking any antihypertensive medication at this time.  She feels like this has improved after she stopped Vyvanse .  She also reports she is currently being treated for a sinus infection with doxycycline.  She reports her symptoms have improved but she still having a lot of head pressure.  Review of Systems   Past Medical History:  Diagnosis Date   ADHD    Anxiety    GERD (gastroesophageal reflux disease)    PONV (postoperative nausea and vomiting)    Wears contact lenses     Current Outpatient Medications  Medication Sig Dispense Refill   CREATINE PO Take by mouth.     fluticasone  (FLONASE ) 50 MCG/ACT nasal spray Place 2 sprays into both nostrils daily. 16 g 6   lisdexamfetamine (VYVANSE ) 30 MG capsule Take 1 capsule (30 mg total) by mouth daily. 30 capsule 0   lisdexamfetamine (VYVANSE ) 30 MG capsule Take 1 capsule (30 mg total) by mouth daily. 30 capsule 0   lisdexamfetamine (VYVANSE ) 30 MG capsule Take 1 capsule (30 mg total) by mouth daily. 30 capsule 0   MAGNESIUM CITRATE PO Take 55 mg by mouth daily at 6 (six) AM.     progesterone (PROMETRIUM) 100 MG capsule Take  100 mg by mouth daily.     sertraline  (ZOLOFT ) 25 MG tablet TAKE 1 TABLET(25 MG) BY MOUTH DAILY 90 tablet 0   Testosterone 75 MG PLLT 75 mg by Implant route every 30 (thirty) days. Actually every 3 months     No current facility-administered medications for this visit.    Allergies  Allergen Reactions   Crab [Shellfish Allergy] Swelling and Other (See Comments)    Crab only no issues with any other shell fish happened when a child not certain of details   Penicillins     Does not recall reaction happened when she was a child     Family History  Problem Relation Age of Onset   COPD Mother    Hypertension Father    Hodgkin's lymphoma Brother    Lung cancer Maternal Grandfather    Breast cancer Paternal Grandmother     Social History   Socioeconomic History   Marital status: Married    Spouse name: Gael Delude   Number of children: 2   Years of education: Not on file   Highest education level: Associate degree: occupational, Scientist, product/process development, or vocational program  Occupational History   Not on file  Tobacco Use   Smoking status: Former    Passive exposure: Past   Smokeless tobacco: Never  Vaping Use   Vaping  status: Never Used  Substance and Sexual Activity   Alcohol use: Yes    Comment: occ.   Drug use: Never   Sexual activity: Yes    Birth control/protection: None  Other Topics Concern   Not on file  Social History Narrative   Not on file   Social Drivers of Health   Financial Resource Strain: Low Risk  (09/24/2023)   Overall Financial Resource Strain (CARDIA)    Difficulty of Paying Living Expenses: Not hard at all  Food Insecurity: No Food Insecurity (09/24/2023)   Hunger Vital Sign    Worried About Running Out of Food in the Last Year: Never true    Ran Out of Food in the Last Year: Never true  Transportation Needs: No Transportation Needs (09/24/2023)   PRAPARE - Administrator, Civil Service (Medical): No    Lack of Transportation (Non-Medical): No   Physical Activity: Sufficiently Active (09/24/2023)   Exercise Vital Sign    Days of Exercise per Week: 4 days    Minutes of Exercise per Session: 60 min  Stress: No Stress Concern Present (09/24/2023)   [employer] of Occupational Health - Occupational Stress Questionnaire    Feeling of Stress : Only a little  Social Connections: Moderately Isolated (09/24/2023)   Social Connection and Isolation Panel [NHANES]    Frequency of Communication with Friends and Family: More than three times a week    Frequency of Social Gatherings with Friends and Family: More than three times a week    Attends Religious Services: Never    Database administrator or Organizations: No    Attends Engineer, structural: Not on file    Marital Status: Married  Catering manager Violence: Not on file     Constitutional: Denies fever, malaise, fatigue, headache or abrupt weight changes.  HEENT: Denies eye pain, eye redness, ear pain, ringing in the ears, wax buildup, runny nose, nasal congestion, bloody nose, or sore throat. Respiratory: Denies difficulty breathing, shortness of breath, cough or sputum production.   Cardiovascular: Denies chest pain, chest tightness, palpitations or swelling in the hands or feet.  Gastrointestinal: Denies abdominal pain, bloating, constipation, diarrhea or blood in the stool.  GU: Denies urgency, frequency, pain with urination, burning sensation, blood in urine, odor or discharge. Musculoskeletal: Denies decrease in range of motion, difficulty with gait, muscle pain or joint pain and swelling.  Skin: Denies redness, rashes, lesions or ulcercations.  Neurological: Patient reports inattention.  Denies dizziness, difficulty with memory, difficulty with speech or problems with balance and coordination.  Psych: Patient has a history of anxiety and depression.  Denies SI/HI.  No other specific complaints in a complete review of systems (except as listed in HPI above).       Objective:   Physical Exam  BP (!) 140/80   Ht 5\' 2"  (1.575 m)   Wt 181 lb 3.2 oz (82.2 kg)   BMI 33.14 kg/m   Wt Readings from Last 3 Encounters:  05/11/23 180 lb (81.6 kg)  02/08/23 183 lb (83 kg)  10/10/22 189 lb (85.7 kg)    General: Appears her stated age, obese, in NAD. Skin: Warm, dry and intact.  HEENT: Head: normal shape and size, no sinus tenderness noted; Eyes: sclera white, no icterus, conjunctiva pink, PERRLA and EOMs intact; Nose: mucosa pink and moist, septum midline; Throat/Mouth: Teeth present, mucosa pink and moist, no exudate, lesions or ulcerations noted.  Neck: No adenopathy noted. Cardiovascular: Normal rate and  rhythm. S1,S2 noted.  No murmur, rubs or gallops noted. No JVD or BLE edema. No carotid bruits noted. Pulmonary/Chest: Normal effort and positive vesicular breath sounds. No respiratory distress. No wheezes, rales or ronchi noted.  Musculoskeletal:  No difficulty with gait.  Neurological: Alert and oriented.  Coordination normal.  Psychiatric: Mood and affect normal. Behavior is normal. Judgment and thought content normal.    BMET    Component Value Date/Time   NA 137 05/04/2023 0857   NA 137 02/12/2013 2036   K 4.6 05/04/2023 0857   K 3.4 (L) 02/12/2013 2036   CL 98 05/04/2023 0857   CL 103 02/12/2013 2036   CO2 23 05/04/2023 0857   CO2 30 02/12/2013 2036   GLUCOSE 70 05/04/2023 0857   GLUCOSE 104 (H) 02/12/2013 2036   BUN 19 05/04/2023 0857   BUN 14 02/12/2013 2036   CREATININE 0.97 05/04/2023 0857   CREATININE 1.01 02/12/2013 2036   CALCIUM 9.6 05/04/2023 0857   CALCIUM 9.3 02/12/2013 2036   GFRNONAA 69 02/27/2019 0802   GFRNONAA >60 02/12/2013 2036   GFRAA 79 02/27/2019 0802   GFRAA >60 02/12/2013 2036    Lipid Panel     Component Value Date/Time   CHOL 227 (H) 05/04/2023 0857   TRIG 70 05/04/2023 0857   HDL 98 05/04/2023 0857   CHOLHDL 2.3 05/04/2023 0857   LDLCALC 117 (H) 05/04/2023 0857    CBC    Component Value  Date/Time   WBC 5.7 05/04/2023 0857   WBC 10.6 02/12/2013 2036   RBC 4.37 05/04/2023 0857   RBC 4.21 02/12/2013 2036   HGB 13.0 05/04/2023 0857   HCT 39.4 05/04/2023 0857   PLT 261 05/04/2023 0857   MCV 90 05/04/2023 0857   MCV 84 02/12/2013 2036   MCH 29.7 05/04/2023 0857   MCH 29.7 02/12/2013 2036   MCHC 33.0 05/04/2023 0857   MCHC 35.3 02/12/2013 2036   RDW 12.5 05/04/2023 0857   RDW 13.1 02/12/2013 2036   LYMPHSABS 1.8 05/04/2023 0857   EOSABS 0.1 05/04/2023 0857   BASOSABS 0.0 05/04/2023 0857    Hgb A1C Lab Results  Component Value Date   HGBA1C 5.4 05/04/2023            Assessment & Plan:  Acute bacterial sinusitis:  Continue doxycycline until your course is completed Rx for Pred taper x 6 days  RTC in 6 months for your annual exam Helayne Lo, NP

## 2023-09-25 NOTE — Assessment & Plan Note (Signed)
 She does not want to start antihypertensive medications at this time Reinforced DASH diet

## 2023-09-25 NOTE — Assessment & Plan Note (Signed)
 Currently not an issue We will monitor

## 2023-09-25 NOTE — Patient Instructions (Signed)

## 2023-09-25 NOTE — Assessment & Plan Note (Signed)
 Encouraged diet and exercise for weight loss ?

## 2023-09-27 DIAGNOSIS — F419 Anxiety disorder, unspecified: Secondary | ICD-10-CM | POA: Diagnosis not present

## 2023-09-27 DIAGNOSIS — N951 Menopausal and female climacteric states: Secondary | ICD-10-CM | POA: Diagnosis not present

## 2023-09-27 DIAGNOSIS — R232 Flushing: Secondary | ICD-10-CM | POA: Diagnosis not present

## 2023-09-27 DIAGNOSIS — Z6832 Body mass index (BMI) 32.0-32.9, adult: Secondary | ICD-10-CM | POA: Diagnosis not present

## 2023-10-23 DIAGNOSIS — M5412 Radiculopathy, cervical region: Secondary | ICD-10-CM | POA: Insufficient documentation

## 2023-10-24 ENCOUNTER — Ambulatory Visit: Payer: Self-pay | Admitting: Internal Medicine

## 2023-11-01 DIAGNOSIS — M9902 Segmental and somatic dysfunction of thoracic region: Secondary | ICD-10-CM | POA: Diagnosis not present

## 2023-11-01 DIAGNOSIS — M9901 Segmental and somatic dysfunction of cervical region: Secondary | ICD-10-CM | POA: Diagnosis not present

## 2023-11-01 DIAGNOSIS — M5413 Radiculopathy, cervicothoracic region: Secondary | ICD-10-CM | POA: Diagnosis not present

## 2023-11-01 DIAGNOSIS — M5032 Other cervical disc degeneration, mid-cervical region, unspecified level: Secondary | ICD-10-CM | POA: Diagnosis not present

## 2023-11-02 DIAGNOSIS — M5032 Other cervical disc degeneration, mid-cervical region, unspecified level: Secondary | ICD-10-CM | POA: Diagnosis not present

## 2023-11-02 DIAGNOSIS — M9902 Segmental and somatic dysfunction of thoracic region: Secondary | ICD-10-CM | POA: Diagnosis not present

## 2023-11-02 DIAGNOSIS — M9901 Segmental and somatic dysfunction of cervical region: Secondary | ICD-10-CM | POA: Diagnosis not present

## 2023-11-02 DIAGNOSIS — M5413 Radiculopathy, cervicothoracic region: Secondary | ICD-10-CM | POA: Diagnosis not present

## 2023-11-06 DIAGNOSIS — M9902 Segmental and somatic dysfunction of thoracic region: Secondary | ICD-10-CM | POA: Diagnosis not present

## 2023-11-06 DIAGNOSIS — M5032 Other cervical disc degeneration, mid-cervical region, unspecified level: Secondary | ICD-10-CM | POA: Diagnosis not present

## 2023-11-06 DIAGNOSIS — M9901 Segmental and somatic dysfunction of cervical region: Secondary | ICD-10-CM | POA: Diagnosis not present

## 2023-11-06 DIAGNOSIS — M5413 Radiculopathy, cervicothoracic region: Secondary | ICD-10-CM | POA: Diagnosis not present

## 2023-11-08 ENCOUNTER — Ambulatory Visit: Payer: Self-pay

## 2023-11-08 DIAGNOSIS — M5413 Radiculopathy, cervicothoracic region: Secondary | ICD-10-CM | POA: Diagnosis not present

## 2023-11-08 DIAGNOSIS — M9901 Segmental and somatic dysfunction of cervical region: Secondary | ICD-10-CM | POA: Diagnosis not present

## 2023-11-08 DIAGNOSIS — M5032 Other cervical disc degeneration, mid-cervical region, unspecified level: Secondary | ICD-10-CM | POA: Diagnosis not present

## 2023-11-08 DIAGNOSIS — M9902 Segmental and somatic dysfunction of thoracic region: Secondary | ICD-10-CM | POA: Diagnosis not present

## 2023-11-08 NOTE — Telephone Encounter (Signed)
 FYI Only or Action Required?: Action required by provider: request for appointment.  Patient was last seen in primary care on 09/25/2023 by Antonette Angeline ORN, NP.  Called Nurse Triage reporting Headache.  Symptoms began several weeks ago.  Interventions attempted: OTC medications: tylenol  and ibuprofen .  Symptoms are: gradually worsening.  Triage Disposition: See PCP When Office is Open (Within 3 Days)  Patient/caregiver understands and will follow disposition?: YesCopied from CRM #[id number]. Topic: Clinical - Red Word Triage >> Nov 08, 2023  8:23 AM Vena H wrote: Red Word that prompted transfer to Nurse Triage: pt has been having headaches on and off for about a month and got alerted on her watch about Afib a few weeks ago and also had a high blood reading at a chiro appointment last Thursday. Reason for Disposition  [1] MILD-MODERATE headache AND [2] present > 3 days (72 hours) AND [3] no improvement after using Care Advice  Answer Assessment - Initial Assessment Questions 1. LOCATION: Where does it hurt?      Front of head 2. ONSET: When did the headache start? (e.g., minutes, hours, days)      June 3. PATTERN: Does the pain come and go, or has it been constant since it started?     Comes and goes 4. SEVERITY: How bad is the pain? and What does it keep you from doing?  (e.g., Scale 1-10; mild, moderate, or severe)     severe  9. OTHER SYMPTOMS: Do you have any other symptoms? (e.g., fever, stiff neck, eye pain, sore throat, cold symptoms)     Stiff neck, runny nose, coughing while lying down.     Pt feels headache is from pinched nerve in neck. Pt goes to chiropractor and has gotten some relief.. Pt has sinus pressure too in frontal lobe. Pt has gotten a fib notification on apple watch recently and was created concern.  Pt is an athlete and very active. Pt's father died 10 days ago. Pt feels anxiety is coming back like when daughter went off to college. Pt is denying  SOB and chest pain. Pt  has taken ibuprofen  and tylenol  for headache. Pt is lying in bed resting. Pt requested to only see PCP.  Protocols used: [facility]

## 2023-11-08 NOTE — Telephone Encounter (Signed)
 Will try to get her worked in tomorrow if she is available, otherwise we will see her Friday

## 2023-11-09 ENCOUNTER — Ambulatory Visit (INDEPENDENT_AMBULATORY_CARE_PROVIDER_SITE_OTHER): Admitting: Internal Medicine

## 2023-11-09 ENCOUNTER — Ambulatory Visit: Admitting: Internal Medicine

## 2023-11-09 ENCOUNTER — Ambulatory Visit: Attending: Internal Medicine

## 2023-11-09 VITALS — BP 159/102 | Ht 62.0 in | Wt 182.4 lb

## 2023-11-09 DIAGNOSIS — G44209 Tension-type headache, unspecified, not intractable: Secondary | ICD-10-CM

## 2023-11-09 DIAGNOSIS — G8929 Other chronic pain: Secondary | ICD-10-CM

## 2023-11-09 DIAGNOSIS — R5383 Other fatigue: Secondary | ICD-10-CM | POA: Diagnosis not present

## 2023-11-09 DIAGNOSIS — R002 Palpitations: Secondary | ICD-10-CM

## 2023-11-09 DIAGNOSIS — R0602 Shortness of breath: Secondary | ICD-10-CM

## 2023-11-09 DIAGNOSIS — M542 Cervicalgia: Secondary | ICD-10-CM

## 2023-11-09 DIAGNOSIS — F4320 Adjustment disorder, unspecified: Secondary | ICD-10-CM

## 2023-11-09 DIAGNOSIS — I10 Essential (primary) hypertension: Secondary | ICD-10-CM

## 2023-11-09 MED ORDER — METHOCARBAMOL 500 MG PO TABS
500.0000 mg | ORAL_TABLET | Freq: Three times a day (TID) | ORAL | 0 refills | Status: DC | PRN
Start: 2023-11-09 — End: 2023-11-30

## 2023-11-09 NOTE — Progress Notes (Signed)
 Subjective:    Patient ID: Jennifer Branch, female    DOB: 1966-02-23, 58 y.o.   MRN: [id number]  HPI  Discussed the use of AI scribe software for clinical note transcription with the patient, who gave verbal consent to proceed.  Jennifer Branch is a 58 year old female with hypertension who presents with headaches, neck pain, palpitations and shortness of breath.  She experiences palpitations and a racing heart rate, first noticed on a Sunday before her father's passing. Her heart rate fluctuated between 111 and 60 beats per minute for a couple of hours. Her smartwatch alerted her to possible atrial fibrillation, prompting her to seek medical attention. She currently has no palpitations but notes fatigue and shortness of breath, particularly when walking around the gym.  She is no longer taking vyvanse .  She has been under significant stress lately with the recent passing of her father.  She has a history of high blood pressure, which she became aware of approximately two years ago. She associates the onset of her hypertension with starting testosterone therapy around the same time. Her blood pressure readings have been high, with recent measurements at 162/110. She has not been on any blood pressure medication.  She has been dealing with a pinched nerve in her neck, for which she initially received prednisone  and anti-inflammatories from an orthopedic specialist. These treatments were ineffective, and she sought chiropractic care, which has provided some relief. She ices her neck twice daily but reports difficulty sleeping due to discomfort. She experiences frontal headaches, which she attributes to either her neck issues, sinus pressure or high blood pressure.  She mentions chronic sinus issues, which she has not been treating with any nasal sprays or medications recently.     Review of Systems   Past Medical History:  Diagnosis Date   ADHD    Anxiety    GERD (gastroesophageal reflux  disease)    PONV (postoperative nausea and vomiting)    Wears contact lenses     Current Outpatient Medications  Medication Sig Dispense Refill   CREATINE PO Take by mouth.     fluticasone  (FLONASE ) 50 MCG/ACT nasal spray Place 2 sprays into both nostrils daily. 16 g 6   lisdexamfetamine (VYVANSE ) 30 MG capsule Take 1 capsule (30 mg total) by mouth daily. (Patient not taking: Reported on 09/25/2023) 30 capsule 0   MAGNESIUM CITRATE PO Take 55 mg by mouth daily at 6 (six) AM.     predniSONE  (DELTASONE ) 10 MG tablet Take 6 tabs on day 1, 5 tabs on day 2, 4 tabs on day 3, 3 tabs on day 4, 2 tabs on day 5, 1 tab on day 6 21 tablet 0   progesterone (PROMETRIUM) 100 MG capsule Take 100 mg by mouth daily.     Testosterone 75 MG PLLT 75 mg by Implant route every 30 (thirty) days. Actually every 3 months     No current facility-administered medications for this visit.    Allergies  Allergen Reactions   Crab [Shellfish Allergy] Swelling and Other (See Comments)    Crab only no issues with any other shell fish happened when a child not certain of details   Penicillins     Does not recall reaction happened when she was a child     Family History  Problem Relation Age of Onset   COPD Mother    Hypertension Father    Hodgkin's lymphoma Brother    Lung cancer Maternal Grandfather  Breast cancer Paternal Grandmother     Social History   Socioeconomic History   Marital status: Married    Spouse name: Jahdai Padovano   Number of children: 2   Years of education: Not on file   Highest education level: Associate degree: occupational, Scientist, product/process development, or vocational program  Occupational History   Not on file  Tobacco Use   Smoking status: Former    Passive exposure: Past   Smokeless tobacco: Never  Vaping Use   Vaping status: Never Used  Substance and Sexual Activity   Alcohol use: Yes    Comment: occ.   Drug use: Never   Sexual activity: Yes    Birth control/protection: None  Other  Topics Concern   Not on file  Social History Narrative   Not on file   Social Drivers of Health   Financial Resource Strain: Low Risk  (09/24/2023)   Overall Financial Resource Strain (CARDIA)    Difficulty of Paying Living Expenses: Not hard at all  Food Insecurity: No Food Insecurity (09/24/2023)   Hunger Vital Sign    Worried About Running Out of Food in the Last Year: Never true    Ran Out of Food in the Last Year: Never true  Transportation Needs: No Transportation Needs (09/24/2023)   PRAPARE - Administrator, Civil Service (Medical): No    Lack of Transportation (Non-Medical): No  Physical Activity: Sufficiently Active (09/24/2023)   Exercise Vital Sign    Days of Exercise per Week: 4 days    Minutes of Exercise per Session: 60 min  Stress: No Stress Concern Present (09/24/2023)   [employer] of Occupational Health - Occupational Stress Questionnaire    Feeling of Stress : Only a little  Social Connections: Moderately Isolated (09/24/2023)   Social Connection and Isolation Panel    Frequency of Communication with Friends and Family: More than three times a week    Frequency of Social Gatherings with Friends and Family: More than three times a week    Attends Religious Services: Never    Database administrator or Organizations: No    Attends Engineer, structural: Not on file    Marital Status: Married  Catering manager Violence: Not on file     Constitutional: Patient reports frequent headaches, fatigue.  Denies fever, malaise, fatigue, or abrupt weight changes.  HEENT: Patient reports sinus pressure.  Denies eye pain, eye redness, ear pain, ringing in the ears, wax buildup, runny nose, nasal congestion, bloody nose, or sore throat. Respiratory: Pt reports shortness of breath. Denies difficulty breathing, cough or sputum production.   Cardiovascular: Patient reports palpitations.  Denies chest pain, chest tightness, or swelling in the hands or feet.   Gastrointestinal: Denies abdominal pain, bloating, constipation, diarrhea or blood in the stool.  GU: Denies urgency, frequency, pain with urination, burning sensation, blood in urine, odor or discharge. Musculoskeletal: Patient reports chronic neck pain.  Denies decrease in range of motion, difficulty with gait, muscle pain or joint swelling.  Skin: Denies redness, rashes, lesions or ulcercations.  Neurological: Patient reports inattention.  Denies dizziness, difficulty with memory, difficulty with speech or problems with balance and coordination.  Psych: Patient has a history of anxiety and depression.  Denies SI/HI.  No other specific complaints in a complete review of systems (except as listed in HPI above).      Objective:   Physical Exam  BP (!) 159/102   Ht 5' 2 (1.575 m)   Wt 182  lb 6 oz (82.7 kg)   BMI 33.36 kg/m    Wt Readings from Last 3 Encounters:  09/25/23 181 lb 3.2 oz (82.2 kg)  05/11/23 180 lb (81.6 kg)  02/08/23 183 lb (83 kg)    General: Appears her stated age, obese, in NAD. Skin: Warm, dry and intact.  HEENT: Head: normal shape and size, no sinus tenderness noted; Eyes: sclera white, no icterus, conjunctiva pink, PERRLA and EOMs intact;  Cardiovascular: Tachycardic with normal rhythm. S1,S2 noted.  No murmur, rubs or gallops noted. No JVD or BLE edema.  Pulmonary/Chest: Normal effort and positive vesicular breath sounds. No respiratory distress. No wheezes, rales or ronchi noted.  Musculoskeletal:  No difficulty with gait.  Neurological: Alert and oriented.  Coordination normal.  Psychiatric: Mood and affect mildly flat. Behavior is normal. Judgment and thought content normal.    BMET    Component Value Date/Time   NA 137 05/04/2023 0857   NA 137 02/12/2013 2036   K 4.6 05/04/2023 0857   K 3.4 (L) 02/12/2013 2036   CL 98 05/04/2023 0857   CL 103 02/12/2013 2036   CO2 23 05/04/2023 0857   CO2 30 02/12/2013 2036   GLUCOSE 70 05/04/2023 0857    GLUCOSE 104 (H) 02/12/2013 2036   BUN 19 05/04/2023 0857   BUN 14 02/12/2013 2036   CREATININE 0.97 05/04/2023 0857   CREATININE 1.01 02/12/2013 2036   CALCIUM 9.6 05/04/2023 0857   CALCIUM 9.3 02/12/2013 2036   GFRNONAA 69 02/27/2019 0802   GFRNONAA >60 02/12/2013 2036   GFRAA 79 02/27/2019 0802   GFRAA >60 02/12/2013 2036    Lipid Panel     Component Value Date/Time   CHOL 227 (H) 05/04/2023 0857   TRIG 70 05/04/2023 0857   HDL 98 05/04/2023 0857   CHOLHDL 2.3 05/04/2023 0857   LDLCALC 117 (H) 05/04/2023 0857    CBC    Component Value Date/Time   WBC 5.7 05/04/2023 0857   WBC 10.6 02/12/2013 2036   RBC 4.37 05/04/2023 0857   RBC 4.21 02/12/2013 2036   HGB 13.0 05/04/2023 0857   HCT 39.4 05/04/2023 0857   PLT 261 05/04/2023 0857   MCV 90 05/04/2023 0857   MCV 84 02/12/2013 2036   MCH 29.7 05/04/2023 0857   MCH 29.7 02/12/2013 2036   MCHC 33.0 05/04/2023 0857   MCHC 35.3 02/12/2013 2036   RDW 12.5 05/04/2023 0857   RDW 13.1 02/12/2013 2036   LYMPHSABS 1.8 05/04/2023 0857   EOSABS 0.1 05/04/2023 0857   BASOSABS 0.0 05/04/2023 0857    Hgb A1C Lab Results  Component Value Date   HGBA1C 5.4 05/04/2023            Assessment & Plan:  Assessment and Plan    Palpitations, shortness of breath and fatigue Intermittent palpitations and elevated heart rate suggest paroxysmal atrial fibrillation. Normal ECG today, but further monitoring needed. - Order Holter monitor for 2-3 days. - Instruct her to report significant symptom changes. - Indication for ECG: Palpitations - Interpretation of ECG: Sinus tachycardia but no evidence of A-fib - Comparison of ECG: 01/2013, new onset tachycardia  Hypertension Blood pressure elevated at 162/110 mmHg. Possible exacerbation from testosterone therapy, now discontinued. Prefers non-pharmacological management. - Monitor blood pressure at home, target <130/80 mmHg. - Consider antihypertensive medication if BP remains  150-160/100 mmHg over next week. - She has decided that she will no longer be getting the testosterone pellets  Cervical Radiculopathy, frequent headaches Neck pain  and headaches improved with chiropractic care. Headaches may be related to neck tension, hypertension, or sinus issues. - Prescribe methocarbamol  500 mg TID as needed, preferably at bedtime. - Recommend Flonase  BID for sinus-related headache relief. - Continue chiropractic care and icing.  Stress Management Stress from life events may contribute to symptoms like chest tightness and headaches. - Monitor for symptom improvement as stress decreases.        RTC in 5 months for your annual exam Angeline Laura, NP

## 2023-11-09 NOTE — Patient Instructions (Signed)
 Palpitations Palpitations are feelings that your heartbeat is not normal. Your heartbeat may feel like it is: Uneven (irregular). Faster than normal. Fluttering. Skipping a beat. This is usually not a serious problem. However, a doctor will do tests and check your medical history to make sure that you do not have a serious heart problem. Follow these instructions at home: Watch for any changes in your condition. Tell your doctor about any changes. Take these actions to help manage your symptoms: Eating and drinking Follow instructions from your doctor about things to eat and drink. You may be told to avoid these things: Drinks that have caffeine in them, such as coffee, tea, soft drinks, and energy drinks. Chocolate. Alcohol. Diet pills. Lifestyle     Try to lower your stress. These things can help you relax: Yoga. Deep breathing and meditation. Guided imagery. This is using words and images to create positive thoughts. Exercise, including swimming, jogging, and walking. Tell your doctor if you have more abnormal heartbeats when you are active. If you have chest pain or feel short of breath with exercise, do not keep doing the exercise until you are seen by your doctor. Biofeedback. This is using your mind to control things in your body, such as your heartbeat. Get plenty of rest and sleep. Keep a regular bed time. Do not use drugs, such as cocaine or ecstasy. Do not use marijuana. Do not smoke or use any products that contain nicotine or tobacco. If you need help quitting, ask your doctor. General instructions Take over-the-counter and prescription medicines only as told by your doctor. Keep all follow-up visits. You may need more tests if palpitations do not go away or get worse. Contact a doctor if: You keep having fast or uneven heartbeats for a long time. Your symptoms happen more often. Get help right away if: You have chest pain. You feel short of breath. You have a very  bad headache. You feel dizzy. You faint. These symptoms may be an emergency. Get help right away. Call your local emergency services (911 in the [place] Do not wait to see if the symptoms will go away. Do not drive yourself to the hospital. Summary Palpitations are feelings that your heartbeat is uneven or faster than normal. It may feel like your heart is fluttering or skipping a beat. Avoid food and drinks that may cause this condition. These include caffeine, chocolate, and alcohol. Try to lower your stress. Do not smoke or use drugs. Get help right away if you faint, feel dizzy, feel short of breath, have chest pain, or have a very bad headache. This information is not intended to replace advice given to you by your health care provider. Make sure you discuss any questions you have with your health care provider. Document Revised: 08/26/2020 Document Reviewed: 08/26/2020 Elsevier Patient Education  2024 [employer].

## 2023-11-10 ENCOUNTER — Ambulatory Visit: Admitting: Internal Medicine

## 2023-11-12 ENCOUNTER — Encounter: Payer: Self-pay | Admitting: Internal Medicine

## 2023-11-21 ENCOUNTER — Other Ambulatory Visit: Payer: Self-pay | Admitting: Internal Medicine

## 2023-11-21 DIAGNOSIS — Z1231 Encounter for screening mammogram for malignant neoplasm of breast: Secondary | ICD-10-CM

## 2023-11-23 DIAGNOSIS — R002 Palpitations: Secondary | ICD-10-CM | POA: Diagnosis not present

## 2023-11-23 DIAGNOSIS — R5383 Other fatigue: Secondary | ICD-10-CM | POA: Diagnosis not present

## 2023-11-24 ENCOUNTER — Ambulatory Visit: Payer: Self-pay | Admitting: Internal Medicine

## 2023-11-24 DIAGNOSIS — R002 Palpitations: Secondary | ICD-10-CM | POA: Diagnosis not present

## 2023-11-24 DIAGNOSIS — R5383 Other fatigue: Secondary | ICD-10-CM

## 2023-11-24 DIAGNOSIS — R0602 Shortness of breath: Secondary | ICD-10-CM | POA: Diagnosis not present

## 2023-11-27 DIAGNOSIS — M9902 Segmental and somatic dysfunction of thoracic region: Secondary | ICD-10-CM | POA: Diagnosis not present

## 2023-11-27 DIAGNOSIS — M9901 Segmental and somatic dysfunction of cervical region: Secondary | ICD-10-CM | POA: Diagnosis not present

## 2023-11-27 DIAGNOSIS — M5413 Radiculopathy, cervicothoracic region: Secondary | ICD-10-CM | POA: Diagnosis not present

## 2023-11-27 DIAGNOSIS — M5032 Other cervical disc degeneration, mid-cervical region, unspecified level: Secondary | ICD-10-CM | POA: Diagnosis not present

## 2023-11-29 ENCOUNTER — Encounter: Payer: Self-pay | Admitting: Internal Medicine

## 2023-11-30 ENCOUNTER — Encounter: Payer: Self-pay | Admitting: Internal Medicine

## 2023-11-30 ENCOUNTER — Ambulatory Visit (INDEPENDENT_AMBULATORY_CARE_PROVIDER_SITE_OTHER): Admitting: Internal Medicine

## 2023-11-30 ENCOUNTER — Other Ambulatory Visit (HOSPITAL_COMMUNITY)
Admission: RE | Admit: 2023-11-30 | Discharge: 2023-11-30 | Disposition: A | Discharge status: Home / Self Care | Source: Ambulatory Visit | Attending: Internal Medicine | Admitting: Internal Medicine

## 2023-11-30 VITALS — BP 122/78 | Ht 62.0 in | Wt 179.8 lb

## 2023-11-30 DIAGNOSIS — N898 Other specified noninflammatory disorders of vagina: Secondary | ICD-10-CM | POA: Diagnosis not present

## 2023-11-30 MED ORDER — FLUCONAZOLE 150 MG PO TABS: 150.0000 mg | ORAL_TABLET | Freq: Once | ORAL | 0 refills | Status: AC

## 2023-11-30 NOTE — Patient Instructions (Signed)
 Irritation of the Vagina (Vaginitis): What to Know  Vaginitis is irritation and swelling of the vagina. It happens when the usual balance of bacteria and yeast in the vagina changes. This change causes some types to grow too much. This overgrowth leads to vaginitis. What are the causes? Bacteria. Yeast, which is a fungus. A parasite. A virus. Low hormones in the body. This can occur during pregnancy, breastfeeding, or after menopause. What increases the risk? Irritants, such as douches, bubble baths, scented tampons, and feminine sprays. Antibiotics. Poor hygiene. Wearing tight pants or thong underwear. Some birth control methods, such as diaphragms, vaginal sponges, or spermicides. Having sex without a condom or having sex with more than one person. Infections. Uncontrolled diabetes. What are the signs or symptoms? Abnormal fluid from the vagina. The fluid may be: White, gray, or yellow. Thick, white, and cheesy. Frothy and yellow or green. A bad smell from the vagina. Itching, pain, or swelling in the vagina. Pain during sex. Pain or burning when you pee. How is this diagnosed? This condition is diagnosed based on your symptoms, medical history, and an exam. This may include a pelvic exam. Tests may also be done. Tests may be done to: Check the pH level of your vagina. Check the fluid in your vagina. How is this treated? Treatment will depend on what is causing your vaginitis. Treatment may include: Antibiotics. Antifungal medicines. Medicines to treat symptoms if you have a virus. Your sex partner should also be treated. Estrogen medicines. Medicines to treat allergies. The medicines may be pills or creams. Follow these instructions at home: Lifestyle Keep the area around your vagina clean and dry. Avoid using soap. Rinse the area with water. Until your health care provider says it's okay: Do not douche. Do not use tampons. Use pads, if needed. Do not have sex. Wipe  from front to back after going to the bathroom. When the provider says it's okay, practice safe sex. Use condoms. General instructions Take your medicines only as told. If you were given antibiotics, take them as told. Do not stop taking them even if you start to feel better. How is this prevented? Use mild, unscented products. Avoid the following products if they are scented: Sprays. Detergents. Tampons. Products for cleaning the vagina. Soaps or bubble baths. Let air reach your genital area. To do this: Wear cotton underwear. Do not wear underwear while you sleep. Do not wear tight pants and underwear or pantyhose without a cotton panel. Do not wear thong underwear. Take off any wet clothing, such as bathing suits, as soon as possible. Practice safe sex. Use condoms. Contact a health care provider if: You have pain in the belly or around the pelvis. You have a fever or chills. You have symptoms that last for more than 2-3 days. This information is not intended to replace advice given to you by your health care provider. Make sure you discuss any questions you have with your health care provider. Document Revised: 01/05/2023 Document Reviewed: 08/15/2022 Elsevier Patient Education  2024 [employer].

## 2023-11-30 NOTE — Progress Notes (Signed)
 Subjective:    Patient ID: Jennifer Branch, female    DOB: 03-Aug-1965, 58 y.o.   MRN: [id number]  HPI  Discussed the use of AI scribe software for clinical note transcription with the patient, who gave verbal consent to proceed.  CORLETTE CIANO is a 58 year old female who presents with vaginal itching.  She has been experiencing vaginal itching for the past four days, with no associated discharge or odor. The itching intensifies when the area is wet.  She denies any urinary symptoms such as increased frequency, burning, or hematuria. She denies vaginal discharge, odor or abnormal vaginal bleeding. She is not sexually active been since her father's death. She has not had any recent menstrual periods.  She has recently been on antibiotics and steroids, specifically a Z-Pak and prednisone .        Review of Systems   Past Medical History:  Diagnosis Date   ADHD    Anxiety    GERD (gastroesophageal reflux disease)    PONV (postoperative nausea and vomiting)    Wears contact lenses     Current Outpatient Medications  Medication Sig Dispense Refill   CREATINE PO Take by mouth.     MAGNESIUM CITRATE PO Take 55 mg by mouth daily at 6 (six) AM.     methocarbamol  (ROBAXIN ) 500 MG tablet Take 1 tablet (500 mg total) by mouth every 8 (eight) hours as needed. 30 tablet 0   progesterone (PROMETRIUM) 100 MG capsule Take 100 mg by mouth daily.     Testosterone 75 MG PLLT 75 mg by Implant route every 30 (thirty) days. Actually every 3 months     No current facility-administered medications for this visit.    Allergies  Allergen Reactions   Shellfish Allergy Other (See Comments) and Swelling    Crab only no issues with any other shell fish happened when a child not certain of details  Shellfish (substance)   Penicillins     Does not recall reaction happened when she was a child     Family History  Problem Relation Age of Onset   COPD Mother    Hypertension Father    Hodgkin's  lymphoma Brother    Lung cancer Maternal Grandfather    Breast cancer Paternal Grandmother     Social History   Socioeconomic History   Marital status: Married    Spouse name: Kendi Defalco   Number of children: 2   Years of education: Not on file   Highest education level: Associate degree: occupational, Scientist, product/process development, or vocational program  Occupational History   Not on file  Tobacco Use   Smoking status: Former    Passive exposure: Past   Smokeless tobacco: Never  Vaping Use   Vaping status: Never Used  Substance and Sexual Activity   Alcohol use: Yes    Comment: occ.   Drug use: Never   Sexual activity: Yes    Birth control/protection: None  Other Topics Concern   Not on file  Social History Narrative   Not on file   Social Drivers of Health   Financial Resource Strain: Low Risk  (11/09/2023)   Overall Financial Resource Strain (CARDIA)    Difficulty of Paying Living Expenses: Not hard at all  Food Insecurity: No Food Insecurity (11/09/2023)   Hunger Vital Sign    Worried About Running Out of Food in the Last Year: Never true    Ran Out of Food in the Last Year: Never true  Transportation  Needs: No Transportation Needs (11/09/2023)   PRAPARE - Administrator, Civil Service (Medical): No    Lack of Transportation (Non-Medical): No  Physical Activity: Sufficiently Active (11/09/2023)   Exercise Vital Sign    Days of Exercise per Week: 4 days    Minutes of Exercise per Session: 70 min  Stress: Stress Concern Present (11/09/2023)   [employer] of Occupational Health - Occupational Stress Questionnaire    Feeling of Stress: Very much  Social Connections: Moderately Isolated (11/09/2023)   Social Connection and Isolation Panel    Frequency of Communication with Friends and Family: More than three times a week    Frequency of Social Gatherings with Friends and Family: More than three times a week    Attends Religious Services: Never    Doctor, general practice or Organizations: No    Attends Engineer, structural: Not on file    Marital Status: Married  Catering manager Violence: Not on file     Constitutional: Denies fever, malaise, fatigue, headache or abrupt weight changes.  Respiratory: Denies difficulty breathing, shortness of breath, cough or sputum production.   Cardiovascular: Denies chest pain, chest tightness, palpitations or swelling in the hands or feet.  Gastrointestinal: Denies abdominal pain, bloating, constipation, diarrhea or blood in the stool.  GU: Pt reports vaginal itching. Denies urgency, frequency, pain with urination, burning sensation, blood in urine, odor or discharge. Psych: Patient has a history of anxiety and depression.  Denies SI/HI.  No other specific complaints in a complete review of systems (except as listed in HPI above).      Objective:   Physical Exam  BP 122/78 (BP Location: Right Arm, Patient Position: Sitting, Cuff Size: Normal)   Ht 5' 2 (1.575 m)   Wt 179 lb 12.8 oz (81.6 kg)   BMI 32.89 kg/m   Wt Readings from Last 3 Encounters:  11/09/23 182 lb 6 oz (82.7 kg)  09/25/23 181 lb 3.2 oz (82.2 kg)  05/11/23 180 lb (81.6 kg)    General: Appears her stated age, obese, in NAD. Cardiovascular: Normal rate and rhythm.  Pulmonary/Chest: Normal effort. No respiratory distress. Pelvic: Self swab Neurological: Alert and oriented.   BMET    Component Value Date/Time   NA 137 05/04/2023 0857   NA 137 02/12/2013 2036   K 4.6 05/04/2023 0857   K 3.4 (L) 02/12/2013 2036   CL 98 05/04/2023 0857   CL 103 02/12/2013 2036   CO2 23 05/04/2023 0857   CO2 30 02/12/2013 2036   GLUCOSE 70 05/04/2023 0857   GLUCOSE 104 (H) 02/12/2013 2036   BUN 19 05/04/2023 0857   BUN 14 02/12/2013 2036   CREATININE 0.97 05/04/2023 0857   CREATININE 1.01 02/12/2013 2036   CALCIUM 9.6 05/04/2023 0857   CALCIUM 9.3 02/12/2013 2036   GFRNONAA 69 02/27/2019 0802   GFRNONAA >60 02/12/2013 2036   GFRAA  79 02/27/2019 0802   GFRAA >60 02/12/2013 2036    Lipid Panel     Component Value Date/Time   CHOL 227 (H) 05/04/2023 0857   TRIG 70 05/04/2023 0857   HDL 98 05/04/2023 0857   CHOLHDL 2.3 05/04/2023 0857   LDLCALC 117 (H) 05/04/2023 0857    CBC    Component Value Date/Time   WBC 5.7 05/04/2023 0857   WBC 10.6 02/12/2013 2036   RBC 4.37 05/04/2023 0857   RBC 4.21 02/12/2013 2036   HGB 13.0 05/04/2023 0857   HCT 39.4 05/04/2023 0857  PLT 261 05/04/2023 0857   MCV 90 05/04/2023 0857   MCV 84 02/12/2013 2036   MCH 29.7 05/04/2023 0857   MCH 29.7 02/12/2013 2036   MCHC 33.0 05/04/2023 0857   MCHC 35.3 02/12/2013 2036   RDW 12.5 05/04/2023 0857   RDW 13.1 02/12/2013 2036   LYMPHSABS 1.8 05/04/2023 0857   EOSABS 0.1 05/04/2023 0857   BASOSABS 0.0 05/04/2023 0857    Hgb A1C Lab Results  Component Value Date   HGBA1C 5.4 05/04/2023            Assessment & Plan:   Assessment and Plan    Vaginal itching Recent antibiotics and steroids likely caused yeast infection. Bacterial vaginitis less likely due to absence of discharge and odor. - Prescribed Diflucan  (fluconazole ) 150 mg, single dose.  -Wet prep to check for BV and yeast      RTC in 4 months for your annual exam Angeline Laura, NP

## 2023-12-04 DIAGNOSIS — M25512 Pain in left shoulder: Secondary | ICD-10-CM | POA: Diagnosis not present

## 2023-12-04 DIAGNOSIS — M5413 Radiculopathy, cervicothoracic region: Secondary | ICD-10-CM | POA: Diagnosis not present

## 2023-12-04 DIAGNOSIS — M5032 Other cervical disc degeneration, mid-cervical region, unspecified level: Secondary | ICD-10-CM | POA: Diagnosis not present

## 2023-12-04 DIAGNOSIS — M9902 Segmental and somatic dysfunction of thoracic region: Secondary | ICD-10-CM | POA: Diagnosis not present

## 2023-12-04 DIAGNOSIS — M9901 Segmental and somatic dysfunction of cervical region: Secondary | ICD-10-CM | POA: Diagnosis not present

## 2023-12-04 DIAGNOSIS — R293 Abnormal posture: Secondary | ICD-10-CM | POA: Diagnosis not present

## 2023-12-04 DIAGNOSIS — M542 Cervicalgia: Secondary | ICD-10-CM | POA: Diagnosis not present

## 2023-12-04 LAB — CERVICOVAGINAL ANCILLARY ONLY
Bacterial Vaginitis (gardnerella): POSITIVE — AB
Candida Glabrata: NEGATIVE
Candida Vaginitis: NEGATIVE
Comment: NEGATIVE
Comment: NEGATIVE
Comment: NEGATIVE

## 2023-12-05 ENCOUNTER — Ambulatory Visit: Payer: Self-pay | Admitting: Internal Medicine

## 2023-12-06 MED ORDER — METRONIDAZOLE 0.75 % EX GEL: 1.0000 | Freq: Two times a day (BID) | CUTANEOUS | 0 refills | Status: AC

## 2023-12-07 ENCOUNTER — Ambulatory Visit
Admission: RE | Admit: 2023-12-07 | Discharge: 2023-12-07 | Disposition: A | Discharge status: Home / Self Care | Source: Ambulatory Visit | Attending: Internal Medicine | Admitting: Internal Medicine

## 2023-12-07 DIAGNOSIS — M542 Cervicalgia: Secondary | ICD-10-CM | POA: Diagnosis not present

## 2023-12-07 DIAGNOSIS — M25512 Pain in left shoulder: Secondary | ICD-10-CM | POA: Diagnosis not present

## 2023-12-07 DIAGNOSIS — R293 Abnormal posture: Secondary | ICD-10-CM | POA: Diagnosis not present

## 2023-12-07 DIAGNOSIS — Z1231 Encounter for screening mammogram for malignant neoplasm of breast: Secondary | ICD-10-CM | POA: Insufficient documentation

## 2023-12-11 DIAGNOSIS — M9901 Segmental and somatic dysfunction of cervical region: Secondary | ICD-10-CM | POA: Diagnosis not present

## 2023-12-11 DIAGNOSIS — M9902 Segmental and somatic dysfunction of thoracic region: Secondary | ICD-10-CM | POA: Diagnosis not present

## 2023-12-11 DIAGNOSIS — M5032 Other cervical disc degeneration, mid-cervical region, unspecified level: Secondary | ICD-10-CM | POA: Diagnosis not present

## 2023-12-11 DIAGNOSIS — M5413 Radiculopathy, cervicothoracic region: Secondary | ICD-10-CM | POA: Diagnosis not present

## 2023-12-13 DIAGNOSIS — R293 Abnormal posture: Secondary | ICD-10-CM | POA: Diagnosis not present

## 2023-12-13 DIAGNOSIS — M542 Cervicalgia: Secondary | ICD-10-CM | POA: Diagnosis not present

## 2023-12-13 DIAGNOSIS — M25512 Pain in left shoulder: Secondary | ICD-10-CM | POA: Diagnosis not present

## 2023-12-20 DIAGNOSIS — M542 Cervicalgia: Secondary | ICD-10-CM | POA: Diagnosis not present

## 2023-12-20 DIAGNOSIS — R293 Abnormal posture: Secondary | ICD-10-CM | POA: Diagnosis not present

## 2023-12-20 DIAGNOSIS — M25512 Pain in left shoulder: Secondary | ICD-10-CM | POA: Diagnosis not present

## 2023-12-21 DIAGNOSIS — M5413 Radiculopathy, cervicothoracic region: Secondary | ICD-10-CM | POA: Diagnosis not present

## 2023-12-21 DIAGNOSIS — M5032 Other cervical disc degeneration, mid-cervical region, unspecified level: Secondary | ICD-10-CM | POA: Diagnosis not present

## 2023-12-21 DIAGNOSIS — M9901 Segmental and somatic dysfunction of cervical region: Secondary | ICD-10-CM | POA: Diagnosis not present

## 2023-12-21 DIAGNOSIS — M9902 Segmental and somatic dysfunction of thoracic region: Secondary | ICD-10-CM | POA: Diagnosis not present

## 2023-12-25 MED ORDER — METRONIDAZOLE 0.75 % EX GEL: 1.0000 | Freq: Two times a day (BID) | CUTANEOUS | 0 refills | Status: DC

## 2023-12-25 NOTE — Addendum Note (Signed)
 Addended by: ANTONETTE ANGELINE ORN on: 12/25/2023 09:21 AM   Modules accepted: Orders

## 2023-12-28 DIAGNOSIS — R002 Palpitations: Secondary | ICD-10-CM | POA: Insufficient documentation

## 2023-12-28 NOTE — Progress Notes (Deleted)
 NO SHOW

## 2023-12-29 ENCOUNTER — Ambulatory Visit: Attending: Cardiovascular Disease | Admitting: Cardiovascular Disease

## 2023-12-29 DIAGNOSIS — E7800 Pure hypercholesterolemia, unspecified: Secondary | ICD-10-CM

## 2023-12-29 DIAGNOSIS — I10 Essential (primary) hypertension: Secondary | ICD-10-CM

## 2023-12-29 DIAGNOSIS — R002 Palpitations: Secondary | ICD-10-CM

## 2024-01-02 DIAGNOSIS — H0014 Chalazion left upper eyelid: Secondary | ICD-10-CM | POA: Diagnosis not present

## 2024-01-02 DIAGNOSIS — H35361 Drusen (degenerative) of macula, right eye: Secondary | ICD-10-CM | POA: Diagnosis not present

## 2024-01-02 DIAGNOSIS — H1045 Other chronic allergic conjunctivitis: Secondary | ICD-10-CM | POA: Diagnosis not present

## 2024-01-04 DIAGNOSIS — M9901 Segmental and somatic dysfunction of cervical region: Secondary | ICD-10-CM | POA: Diagnosis not present

## 2024-01-04 DIAGNOSIS — M5413 Radiculopathy, cervicothoracic region: Secondary | ICD-10-CM | POA: Diagnosis not present

## 2024-01-04 DIAGNOSIS — M5032 Other cervical disc degeneration, mid-cervical region, unspecified level: Secondary | ICD-10-CM | POA: Diagnosis not present

## 2024-01-04 DIAGNOSIS — M9902 Segmental and somatic dysfunction of thoracic region: Secondary | ICD-10-CM | POA: Diagnosis not present

## 2024-01-17 DIAGNOSIS — F419 Anxiety disorder, unspecified: Secondary | ICD-10-CM | POA: Diagnosis not present

## 2024-01-17 DIAGNOSIS — N951 Menopausal and female climacteric states: Secondary | ICD-10-CM | POA: Diagnosis not present

## 2024-01-17 DIAGNOSIS — R232 Flushing: Secondary | ICD-10-CM | POA: Diagnosis not present

## 2024-01-19 DIAGNOSIS — N951 Menopausal and female climacteric states: Secondary | ICD-10-CM | POA: Diagnosis not present

## 2024-01-19 DIAGNOSIS — F32A Depression, unspecified: Secondary | ICD-10-CM | POA: Diagnosis not present

## 2024-01-19 DIAGNOSIS — R232 Flushing: Secondary | ICD-10-CM | POA: Diagnosis not present

## 2024-01-19 DIAGNOSIS — R5382 Chronic fatigue, unspecified: Secondary | ICD-10-CM | POA: Diagnosis not present

## 2024-03-04 DIAGNOSIS — M5032 Other cervical disc degeneration, mid-cervical region, unspecified level: Secondary | ICD-10-CM | POA: Diagnosis not present

## 2024-03-04 DIAGNOSIS — M9902 Segmental and somatic dysfunction of thoracic region: Secondary | ICD-10-CM | POA: Diagnosis not present

## 2024-03-04 DIAGNOSIS — M9903 Segmental and somatic dysfunction of lumbar region: Secondary | ICD-10-CM | POA: Diagnosis not present

## 2024-03-04 DIAGNOSIS — M9901 Segmental and somatic dysfunction of cervical region: Secondary | ICD-10-CM | POA: Diagnosis not present

## 2024-03-18 ENCOUNTER — Encounter: Payer: Self-pay | Admitting: Internal Medicine

## 2024-03-18 ENCOUNTER — Ambulatory Visit (INDEPENDENT_AMBULATORY_CARE_PROVIDER_SITE_OTHER): Admitting: Internal Medicine

## 2024-03-18 VITALS — BP 128/82 | Ht 62.0 in | Wt 190.8 lb

## 2024-03-18 DIAGNOSIS — F988 Other specified behavioral and emotional disorders with onset usually occurring in childhood and adolescence: Secondary | ICD-10-CM | POA: Diagnosis not present

## 2024-03-18 MED ORDER — LISDEXAMFETAMINE DIMESYLATE 30 MG PO CAPS: 30.0000 mg | ORAL_CAPSULE | Freq: Every day | ORAL | 0 refills | Status: DC

## 2024-03-18 NOTE — Progress Notes (Signed)
 Subjective:    Patient ID: Jennifer Branch, female    DOB: 1965/09/15, 58 y.o.   MRN: [id number]  HPI  Discussed the use of AI scribe software for clinical note transcription with the patient, who gave verbal consent to proceed.  Jennifer Branch is a 58 year old female who presents with a request to restart ADHD medication.  She has not taken her ADHD medication for the past year due to the high cost after switching to her husband's insurance. Initially, she thought she could manage without it as she was not working in front of a computer, but she has noticed significant impacts on her mood and energy levels.  She experiences symptoms such as brain fog, which she attributes to ADHD. She describes feeling as though she might be developing Alzheimer's due to her forgetfulness, especially during busy times like Thanksgiving.  Previously, she was on Vyvanse , which she found effective, but it was too expensive. She was previously taking 30 mg capsules once a day.   She has explored options like GoodRx for potential savings and is considering using a coupon to reduce costs further.       Review of Systems   Past Medical History:  Diagnosis Date   ADHD    Anxiety    GERD (gastroesophageal reflux disease)    PONV (postoperative nausea and vomiting)    Wears contact lenses     Current Outpatient Medications  Medication Sig Dispense Refill   CREATINE PO Take by mouth.     metroNIDAZOLE  (METROGEL ) 0.75 % gel Apply 1 Application topically 2 (two) times daily. 45 g 0   Multiple Vitamins-Minerals (HAIR SKIN NAILS PO) Take by mouth.     progesterone  (PROMETRIUM ) 100 MG capsule Take 100 mg by mouth daily.     Testosterone  75 MG PLLT 75 mg by Implant route every 30 (thirty) days. Actually every 3 months     No current facility-administered medications for this visit.    Allergies  Allergen Reactions   Shellfish Allergy Other (See Comments) and Swelling    Crab only no issues with any  other shell fish happened when a child not certain of details  Shellfish (substance)   Penicillins     Does not recall reaction happened when she was a child     Family History  Problem Relation Age of Onset   COPD Mother    Hypertension Father    Hodgkin's lymphoma Brother    Lung cancer Maternal Grandfather    Breast cancer Paternal Grandmother     Social History   Socioeconomic History   Marital status: Married    Spouse name: Emberleigh Reily   Number of children: 2   Years of education: Not on file   Highest education level: Associate degree: occupational, scientist, product/process development, or vocational program  Occupational History   Not on file  Tobacco Use   Smoking status: Former    Passive exposure: Past   Smokeless tobacco: Never  Vaping Use   Vaping status: Never Used  Substance and Sexual Activity   Alcohol use: Yes    Comment: occ.   Drug use: Never   Sexual activity: Yes    Birth control/protection: None  Other Topics Concern   Not on file  Social History Narrative   Not on file   Social Drivers of Health   Financial Resource Strain: Low Risk  (03/17/2024)   Overall Financial Resource Strain (CARDIA)    Difficulty of Paying Living Expenses: Not  hard at all  Food Insecurity: No Food Insecurity (03/17/2024)   Hunger Vital Sign    Worried About Running Out of Food in the Last Year: Never true    Ran Out of Food in the Last Year: Never true  Transportation Needs: No Transportation Needs (03/17/2024)   PRAPARE - Administrator, Civil Service (Medical): No    Lack of Transportation (Non-Medical): No  Physical Activity: Sufficiently Active (03/17/2024)   Exercise Vital Sign    Days of Exercise per Week: 4 days    Minutes of Exercise per Session: 60 min  Stress: No Stress Concern Present (03/17/2024)   [employer] of Occupational Health - Occupational Stress Questionnaire    Feeling of Stress: Only a little  Social Connections: Moderately Integrated  (03/17/2024)   Social Connection and Isolation Panel    Frequency of Communication with Friends and Family: More than three times a week    Frequency of Social Gatherings with Friends and Family: Once a week    Attends Religious Services: Never    Database Administrator or Organizations: Yes    Attends Engineer, Structural: More than 4 times per year    Marital Status: Married  Catering Manager Violence: Not on file     Constitutional: Denies fever, malaise, fatigue, headache or abrupt weight changes.  HEENT: Denies eye pain, eye redness, ear pain, ringing in the ears, wax buildup, runny nose, nasal congestion, bloody nose, or sore throat. Respiratory: Denies difficulty breathing, shortness of breath, cough or sputum production.   Cardiovascular: Denies chest pain, chest tightness, palpitations or swelling in the hands or feet.  Gastrointestinal: Denies abdominal pain, bloating, constipation, diarrhea or blood in the stool.  GU: Denies urgency, frequency, pain with urination, burning sensation, blood in urine, odor or discharge. Musculoskeletal: Patient reports chronic neck pain.  Denies decrease in range of motion, difficulty with gait, muscle pain or joint pain and swelling.  Skin: Denies redness, rashes, lesions or ulcercations.  Neurological: Patient reports inattention, brain fog.  Denies dizziness, difficulty with memory, difficulty with speech or problems with balance and coordination.  Psych: Patient has a history of anxiety and depression.  Denies SI/HI.  No other specific complaints in a complete review of systems (except as listed in HPI above).      Objective:   BP 128/82 (BP Location: Left Arm, Patient Position: Sitting, Cuff Size: Normal)   Ht 5' 2 (1.575 m)   Wt 190 lb 12.8 oz (86.5 kg)   BMI 34.90 kg/m   Wt Readings from Last 3 Encounters:  11/30/23 179 lb 12.8 oz (81.6 kg)  11/09/23 182 lb 6 oz (82.7 kg)  09/25/23 181 lb 3.2 oz (82.2 kg)    General:  Appears her stated age, obese, in NAD. HEENT: Head: normal shape and size, no sinus tenderness noted; Eyes: sclera white, no icterus, conjunctiva pink, PERRLA and EOMs intact;  Cardiovascular: Normal rate and rhythm. S1,S2 noted.  No murmur, rubs or gallops noted. Pulmonary/Chest: Normal effort and positive vesicular breath sounds. No respiratory distress. No wheezes, rales or ronchi noted.  Musculoskeletal:  No difficulty with gait.  Neurological: Alert and oriented.  Coordination normal.  Psychiatric: Mood and affect normal. Behavior is normal. Judgment and thought content normal.    BMET    Component Value Date/Time   NA 137 05/04/2023 0857   NA 137 02/12/2013 2036   K 4.6 05/04/2023 0857   K 3.4 (L) 02/12/2013 2036   CL 98  05/04/2023 0857   CL 103 02/12/2013 2036   CO2 23 05/04/2023 0857   CO2 30 02/12/2013 2036   GLUCOSE 70 05/04/2023 0857   GLUCOSE 104 (H) 02/12/2013 2036   BUN 19 05/04/2023 0857   BUN 14 02/12/2013 2036   CREATININE 0.97 05/04/2023 0857   CREATININE 1.01 02/12/2013 2036   CALCIUM 9.6 05/04/2023 0857   CALCIUM 9.3 02/12/2013 2036   GFRNONAA 69 02/27/2019 0802   GFRNONAA >60 02/12/2013 2036   GFRAA 79 02/27/2019 0802   GFRAA >60 02/12/2013 2036    Lipid Panel     Component Value Date/Time   CHOL 227 (H) 05/04/2023 0857   TRIG 70 05/04/2023 0857   HDL 98 05/04/2023 0857   CHOLHDL 2.3 05/04/2023 0857   LDLCALC 117 (H) 05/04/2023 0857    CBC    Component Value Date/Time   WBC 5.7 05/04/2023 0857   WBC 10.6 02/12/2013 2036   RBC 4.37 05/04/2023 0857   RBC 4.21 02/12/2013 2036   HGB 13.0 05/04/2023 0857   HCT 39.4 05/04/2023 0857   PLT 261 05/04/2023 0857   MCV 90 05/04/2023 0857   MCV 84 02/12/2013 2036   MCH 29.7 05/04/2023 0857   MCH 29.7 02/12/2013 2036   MCHC 33.0 05/04/2023 0857   MCHC 35.3 02/12/2013 2036   RDW 12.5 05/04/2023 0857   RDW 13.1 02/12/2013 2036   LYMPHSABS 1.8 05/04/2023 0857   EOSABS 0.1 05/04/2023 0857   BASOSABS  0.0 05/04/2023 0857    Hgb A1C Lab Results  Component Value Date   HGBA1C 5.4 05/04/2023            Assessment & Plan:  Assessment and Plan    Attention Deficit Hyperactivity Disorder (ADHD) ADHD symptoms unmanaged due to medication cost and preference against medication. Improvement noted with Vyvanse , but discontinued due to cost. Considering generic options for affordability. - Prescribed Vyvanse  30 mg daily. - Sent prescription to [facility]. - Advised to check medication cost and report if too expensive. - Discussed GoodRx for cost reduction. - Instructed to request refill two days before due date.        RTC in 1 week for your annual exam Angeline Laura, NP

## 2024-03-18 NOTE — Patient Instructions (Signed)

## 2024-03-26 ENCOUNTER — Encounter: Admitting: Internal Medicine

## 2024-04-16 ENCOUNTER — Encounter: Payer: Self-pay | Admitting: Internal Medicine

## 2024-04-16 ENCOUNTER — Other Ambulatory Visit: Payer: Self-pay | Admitting: Internal Medicine

## 2024-04-16 MED ORDER — LISDEXAMFETAMINE DIMESYLATE 30 MG PO CAPS: 30.0000 mg | ORAL_CAPSULE | Freq: Every day | ORAL | 0 refills | Status: DC

## 2024-04-17 NOTE — Telephone Encounter (Signed)
 Requested medication (s) are due for refill today: Yes  Requested medication (s) are on the active medication list: Yes  Last refill:  04/16/24  Future visit scheduled: Yes  Notes to clinic:  Unable to refill per protocol, cannot delegate.      Requested Prescriptions  Pending Prescriptions Disp Refills   lisdexamfetamine  (VYVANSE ) 30 MG capsule 30 capsule 0    Sig: Take 1 capsule (30 mg total) by mouth daily.     Not Delegated - Psychiatry:  Stimulants/ADHD Failed - 04/17/2024  3:08 PM      Failed - This refill cannot be delegated      Failed - Urine Drug Screen completed in last 360 days      Passed - Last BP in normal range    BP Readings from Last 1 Encounters:  03/18/24 128/82         Passed - Last Heart Rate in normal range    Pulse Readings from Last 1 Encounters:  05/11/23 70         Passed - Valid encounter within last 6 months    Recent Outpatient Visits           1 month ago Adult attention deficit disorder   [place] [facility] [place], Angeline ORN, NP   4 months ago Vaginal itching   [facility] [place], [place] W, NP   5 months ago Acute non intractable tension-type headache   [place] [facility] [place], Angeline ORN, NP   6 months ago Acute bacterial sinusitis   [place] [facility] [place], Angeline ORN, [place]

## 2024-05-13 ENCOUNTER — Encounter: Admitting: Internal Medicine

## 2024-05-16 MED ORDER — LISDEXAMFETAMINE DIMESYLATE 30 MG PO CAPS: 30.0000 mg | ORAL_CAPSULE | Freq: Every day | ORAL | 0 refills | Status: AC

## 2024-05-16 NOTE — Addendum Note (Signed)
 Addended by: ZELIA GAUZE D on: 05/16/2024 08:21 AM   Modules accepted: Orders

## 2024-06-18 ENCOUNTER — Encounter: Admitting: Internal Medicine
